# Patient Record
Sex: Male | Born: 1960 | Race: White | Hispanic: No | Marital: Single | Smoking: Former smoker
Health system: Southern US, Community
[De-identification: ages and names within clinical notes are randomized; demographics above are authoritative.]

## PROBLEM LIST (undated history)

## (undated) DIAGNOSIS — R112 Nausea with vomiting, unspecified: Secondary | ICD-10-CM

## (undated) DIAGNOSIS — K219 Gastro-esophageal reflux disease without esophagitis: Secondary | ICD-10-CM

## (undated) DIAGNOSIS — H9190 Unspecified hearing loss, unspecified ear: Secondary | ICD-10-CM

## (undated) DIAGNOSIS — R51 Headache: Secondary | ICD-10-CM

## (undated) DIAGNOSIS — I499 Cardiac arrhythmia, unspecified: Secondary | ICD-10-CM

## (undated) DIAGNOSIS — Z9889 Other specified postprocedural states: Secondary | ICD-10-CM

## (undated) DIAGNOSIS — R569 Unspecified convulsions: Secondary | ICD-10-CM

## (undated) DIAGNOSIS — I209 Angina pectoris, unspecified: Secondary | ICD-10-CM

## (undated) DIAGNOSIS — F419 Anxiety disorder, unspecified: Secondary | ICD-10-CM

---

## 1999-03-17 ENCOUNTER — Encounter: Admission: RE | Admit: 1999-03-17 | Discharge: 1999-03-17 | Payer: Self-pay | Admitting: Family Medicine

## 1999-03-17 ENCOUNTER — Ambulatory Visit (HOSPITAL_COMMUNITY): Admission: RE | Admit: 1999-03-17 | Discharge: 1999-03-17 | Payer: Self-pay | Admitting: Family Medicine

## 2001-04-28 ENCOUNTER — Encounter: Admission: RE | Admit: 2001-04-28 | Discharge: 2001-04-28 | Payer: Self-pay | Admitting: Family Medicine

## 2001-08-08 ENCOUNTER — Encounter: Admission: RE | Admit: 2001-08-08 | Discharge: 2001-08-08 | Payer: Self-pay | Admitting: Family Medicine

## 2004-05-26 ENCOUNTER — Encounter: Admission: RE | Admit: 2004-05-26 | Discharge: 2004-05-26 | Payer: Self-pay | Admitting: Allergy and Immunology

## 2007-09-01 ENCOUNTER — Ambulatory Visit (HOSPITAL_COMMUNITY): Admission: RE | Admit: 2007-09-01 | Discharge: 2007-09-01 | Payer: Self-pay | Admitting: General Surgery

## 2007-09-01 HISTORY — PX: HERNIA REPAIR: SHX51

## 2008-05-26 ENCOUNTER — Emergency Department (HOSPITAL_COMMUNITY): Admission: EM | Admit: 2008-05-26 | Discharge: 2008-05-26 | Payer: Self-pay | Admitting: Emergency Medicine

## 2008-05-29 ENCOUNTER — Telehealth (INDEPENDENT_AMBULATORY_CARE_PROVIDER_SITE_OTHER): Payer: Self-pay | Admitting: *Deleted

## 2008-05-30 ENCOUNTER — Encounter (INDEPENDENT_AMBULATORY_CARE_PROVIDER_SITE_OTHER): Payer: Self-pay | Admitting: Family Medicine

## 2010-07-06 ENCOUNTER — Ambulatory Visit (HOSPITAL_COMMUNITY)
Admission: RE | Admit: 2010-07-06 | Discharge: 2010-07-06 | Payer: Self-pay | Source: Home / Self Care | Attending: Surgery | Admitting: Surgery

## 2010-07-06 HISTORY — PX: HERNIA REPAIR: SHX51

## 2010-10-06 LAB — DIFFERENTIAL
Basophils Absolute: 0 10*3/uL (ref 0.0–0.1)
Basophils Relative: 1 % (ref 0–1)
Eosinophils Absolute: 0.1 10*3/uL (ref 0.0–0.7)
Eosinophils Relative: 2 % (ref 0–5)
Lymphocytes Relative: 17 % (ref 12–46)
Lymphs Abs: 1.1 10*3/uL (ref 0.7–4.0)
Monocytes Absolute: 0.5 10*3/uL (ref 0.1–1.0)
Monocytes Relative: 9 % (ref 3–12)
Neutro Abs: 4.3 10*3/uL (ref 1.7–7.7)
Neutrophils Relative %: 72 % (ref 43–77)

## 2010-10-06 LAB — CBC
HCT: 41.2 % (ref 39.0–52.0)
Hemoglobin: 14.4 g/dL (ref 13.0–17.0)
MCH: 32.8 pg (ref 26.0–34.0)
MCHC: 35 g/dL (ref 30.0–36.0)
MCV: 93.8 fL (ref 78.0–100.0)
Platelets: 194 10*3/uL (ref 150–400)
RBC: 4.39 MIL/uL (ref 4.22–5.81)
RDW: 13 % (ref 11.5–15.5)
WBC: 6.1 10*3/uL (ref 4.0–10.5)

## 2010-10-06 LAB — BASIC METABOLIC PANEL
BUN: 16 mg/dL (ref 6–23)
CO2: 27 mEq/L (ref 19–32)
Calcium: 9.4 mg/dL (ref 8.4–10.5)
Chloride: 106 mEq/L (ref 96–112)
Creatinine, Ser: 1.18 mg/dL (ref 0.4–1.5)
GFR calc Af Amer: >60 mL/min (ref >60)
GFR calc non Af Amer: >60 mL/min (ref >60)
Glucose, Bld: 109 mg/dL — ABNORMAL HIGH (ref 70–99)
Potassium: 3.9 mEq/L (ref 3.5–5.1)
Sodium: 141 mEq/L (ref 135–145)

## 2010-10-06 LAB — SURGICAL PCR SCREEN
MRSA, PCR: NEGATIVE
Staphylococcus aureus: POSITIVE — AB

## 2010-12-08 NOTE — Op Note (Signed)
Joel Lawrence, Joel Lawrence                ACCOUNT NO.:  [id number]   MEDICAL RECORD NO.:  [id number]          PATIENT TYPE:  AMB   LOCATION:  DAY                          FACILITY:  [facility]   PHYSICIAN:  Timothy E. Earlene Plater, M.D. DATE OF BIRTH:  [birth year]   DATE OF PROCEDURE:  09/01/2007  DATE OF DISCHARGE:                               OPERATIVE REPORT   PREOPERATIVE DIAGNOSES:  Left inguinal hernia.   POSTOPERATIVE DIAGNOSES:  Left direct inguinal hernia.   OPERATIVE PROCEDURE:  Repair of hernia with mesh.   SURGEON:  Dr. Kendrick Ranch.   This 50 year old Caucasian male has a painful and protruding left  inguinal hernia that is  increasing in size. He wishes to have it  repaired after careful discussion.  His primary risk factors are  obesity.  The patient was seen, identified, the left groin marked.  He  was taken to the operating room, placed supine, LMA general anesthesia  provided.  The left groin was clipped, prepped and draped in the usual  fashion.  Marcaine 0.25% with epinephrine was used throughout for wide  field block.  An incision made over the palpable hernia taken down to  the abundant subcutaneous fatty tissue. The external oblique was splayed  over a large direct hernia.  It was opened further proximally and a huge  lipoma was protruding through the medial aspect of the internal ring.  The cord was small, appeared normal, no evidence for indirect sac.  That  was dissected out and suspended outside laterally and this huge  lipomatous hernia was reduced back into its place.  There was a large  widening of the internal ring. I chose a large mesh patch and plug. I  placed a plug in the defect and sutured it in with individual #0 Prolene  sutures.  I then took the patch and placed it over the floor of the  canal up to and around the internal ring and sewed that to the  surrounding fascia with 2-0 Prolene.  This further reduced the hernia  and snugged up the internal ring.  The cord  structures passed normally.  The external oblique was then closed with 2-0 Vicryl. The deep subcu 2-0  Vicryl and the skin 3-0 Monocryl.  He seemed to have tolerated it well.  The counts were correct and the patient was stable.  He was removed to  the recovery room in good condition.   Written and verbal instructions were given including Percocet #36.  He  will be followed in the outpatient period.      Timothy E. Earlene Plater, M.D.  Electronically Signed     TED/MEDQ  D:  09/01/2007  T:  09/02/2007  Job:  [id number]

## 2011-04-16 LAB — HEMOGLOBIN AND HEMATOCRIT, BLOOD
HCT: 39.6
Hemoglobin: 14

## 2011-04-28 LAB — URINALYSIS, ROUTINE W REFLEX MICROSCOPIC
Bilirubin Urine: NEGATIVE
Glucose, UA: NEGATIVE
Ketones, ur: 15 — AB
Leukocytes, UA: NEGATIVE
Nitrite: NEGATIVE
Protein, ur: 30 — AB
Specific Gravity, Urine: 1.02
Urobilinogen, UA: 1
pH: 5.5

## 2011-04-28 LAB — URINE MICROSCOPIC-ADD ON

## 2011-04-28 LAB — COMPREHENSIVE METABOLIC PANEL
ALT: 49
AST: 36
Albumin: 4.2
Alkaline Phosphatase: 50
BUN: 11
CO2: 25
Calcium: 9.5
Chloride: 99
Creatinine, Ser: 1.14
GFR calc Af Amer: >60
GFR calc non Af Amer: >60
Glucose, Bld: 89
Potassium: 3.7
Sodium: 134 — ABNORMAL LOW
Total Bilirubin: 0.9
Total Protein: 8

## 2011-04-28 LAB — CBC
HCT: 45.5
Hemoglobin: 15.3
MCHC: 33.6
MCV: 92.9
Platelets: 359
RBC: 4.9
RDW: 13.3
WBC: 11.6 — ABNORMAL HIGH

## 2011-04-28 LAB — POCT I-STAT, CHEM 8
BUN: 12
Calcium, Ion: 1.16
Chloride: 100
Creatinine, Ser: 1.4
Glucose, Bld: 97
HCT: 49
Hemoglobin: 16.7
Potassium: 3.9
Sodium: 138
TCO2: 28

## 2011-04-28 LAB — DIFFERENTIAL
Basophils Absolute: 0.1
Basophils Relative: 1
Eosinophils Absolute: 0.1
Eosinophils Relative: 1
Lymphocytes Relative: 16
Lymphs Abs: 1.8
Monocytes Absolute: 0.7
Monocytes Relative: 6
Neutro Abs: 8.8 — ABNORMAL HIGH
Neutrophils Relative %: 77

## 2011-04-28 LAB — LACTIC ACID, PLASMA: Lactic Acid, Venous: 0.7

## 2011-04-28 LAB — LIPASE, BLOOD: Lipase: 25

## 2011-09-04 ENCOUNTER — Emergency Department (HOSPITAL_COMMUNITY)
Admission: EM | Admit: 2011-09-04 | Discharge: 2011-09-04 | Disposition: A | Discharge status: Home / Self Care | Payer: BC Managed Care – PPO | Attending: Emergency Medicine | Admitting: Emergency Medicine

## 2011-09-04 ENCOUNTER — Encounter (HOSPITAL_COMMUNITY): Payer: Self-pay | Admitting: Physical Medicine and Rehabilitation

## 2011-09-04 ENCOUNTER — Emergency Department (HOSPITAL_COMMUNITY): Payer: BC Managed Care – PPO

## 2011-09-04 DIAGNOSIS — K439 Ventral hernia without obstruction or gangrene: Secondary | ICD-10-CM

## 2011-09-04 DIAGNOSIS — Z79899 Other long term (current) drug therapy: Secondary | ICD-10-CM | POA: Insufficient documentation

## 2011-09-04 DIAGNOSIS — K436 Other and unspecified ventral hernia with obstruction, without gangrene: Secondary | ICD-10-CM | POA: Insufficient documentation

## 2011-09-04 LAB — URINALYSIS, ROUTINE W REFLEX MICROSCOPIC
Glucose, UA: NEGATIVE mg/dL
Hgb urine dipstick: NEGATIVE
Ketones, ur: 15 mg/dL — AB
Nitrite: NEGATIVE
Specific Gravity, Urine: 1.023 (ref 1.005–1.030)
Urobilinogen, UA: 1 mg/dL (ref 0.0–1.0)
pH: 8 (ref 5.0–8.0)

## 2011-09-04 LAB — BASIC METABOLIC PANEL
BUN: 15 mg/dL (ref 6–23)
Calcium: 9 mg/dL (ref 8.4–10.5)
Creatinine, Ser: 0.87 mg/dL (ref 0.50–1.35)
GFR calc non Af Amer: >90 mL/min (ref >90)
Glucose, Bld: 102 mg/dL — ABNORMAL HIGH (ref 70–99)
Sodium: 135 mEq/L (ref 135–145)

## 2011-09-04 MED ORDER — SODIUM CHLORIDE 0.9 % IV SOLN
INTRAVENOUS | Status: DC
  Administered 2011-09-04: 08:00:00 via INTRAVENOUS

## 2011-09-04 MED ORDER — IOHEXOL 300 MG/ML  SOLN: 20.0000 mL | INTRAMUSCULAR | Status: AC

## 2011-09-04 MED ORDER — ONDANSETRON HCL 4 MG/2ML IJ SOLN
4.0000 mg | Freq: Once | INTRAMUSCULAR | Status: AC
  Administered 2011-09-04: 4 mg via INTRAVENOUS
  Filled 2011-09-04: qty 2

## 2011-09-04 MED ORDER — OXYCODONE-ACETAMINOPHEN 5-325 MG PO TABS: 1.0000 | ORAL_TABLET | Freq: Four times a day (QID) | ORAL | Status: AC | PRN

## 2011-09-04 MED ORDER — HYDROMORPHONE HCL PF 1 MG/ML IJ SOLN
1.0000 mg | Freq: Once | INTRAMUSCULAR | Status: AC
  Administered 2011-09-04: 1 mg via INTRAVENOUS
  Filled 2011-09-04: qty 1

## 2011-09-04 MED ORDER — IOHEXOL 300 MG/ML  SOLN
120.0000 mL | Freq: Once | INTRAMUSCULAR | Status: AC | PRN
  Administered 2011-09-04: 120 mL via INTRAVENOUS

## 2011-09-04 NOTE — ED Provider Notes (Signed)
History     CSN: [id number]  Arrival date & time 09/04/11  4098   First MD Initiated Contact with Patient 09/04/11 [phone]      Chief Complaint  Patient presents with  . Abdominal Pain    (Consider location/radiation/quality/duration/timing/severity/associated sxs/prior treatment) HPI Comments: History of inguinal hernia repair. No nausea or vomiting. Has had normal bowel movements.  Patient is a 51 y.o. male presenting with abdominal pain. The history is provided by the patient. No language interpreter was used.  Abdominal Pain The primary symptoms of the illness include abdominal pain. The primary symptoms of the illness do not include fever, fatigue, shortness of breath, nausea, vomiting, diarrhea or dysuria. The current episode started yesterday. The onset of the illness was gradual. The problem has been gradually worsening.  The abdominal pain began yesterday. The pain came on gradually. The abdominal pain has been gradually worsening since its onset. The abdominal pain is located in the periumbilical region. The abdominal pain does not radiate. The abdominal pain is relieved by certain positions. The abdominal pain is exacerbated by movement.  Associated with: states began noticing pain when bending at work. The patient has not had a change in bowel habit. Symptoms associated with the illness do not include constipation, urgency or frequency.    History reviewed. No pertinent past medical history.  Past Surgical History  Procedure Date  . Hernia repair     History reviewed. No pertinent family history.  History  Substance Use Topics  . Smoking status: Never Smoker   . Smokeless tobacco: Not on file  . Alcohol Use: No      Review of Systems  Constitutional: Negative for fever, activity change, appetite change and fatigue.  HENT: Negative for congestion, sore throat, rhinorrhea, neck pain and neck stiffness.   Respiratory: Negative for cough and shortness of breath.     Cardiovascular: Negative for chest pain and palpitations.  Gastrointestinal: Positive for abdominal pain. Negative for nausea, vomiting, diarrhea and constipation.  Genitourinary: Negative for dysuria, urgency, frequency, flank pain, scrotal swelling and testicular pain.  Neurological: Negative for dizziness, weakness, light-headedness, numbness and headaches.  All other systems reviewed and are negative.    Allergies  Review of patient's allergies indicates no known allergies.  Home Medications   Current Outpatient Rx  Name Route Sig Dispense Refill  . OMEGA 3 PO Oral Take 3 capsules by mouth 2 (two) times daily.    Marland Kitchen OVER THE COUNTER MEDICATION Oral Take 1 capsule by mouth daily. Testosterone Booster from [facility] while in hospital    . OXYCODONE-ACETAMINOPHEN 5-325 MG PO TABS Oral Take 1-2 tablets by mouth every 6 (six) hours as needed for pain. 20 tablet 0    BP 144/81  Pulse 92  Temp(Src) 98.4 F (36.9 C) (Oral)  Resp 14  SpO2 96%  Physical Exam  Nursing note and vitals reviewed. Constitutional: He is oriented to person, place, and time. He appears well-developed and well-nourished. No distress.  HENT:  Head: Normocephalic and atraumatic.  Mouth/Throat: Oropharynx is clear and moist. No oropharyngeal exudate.  Eyes: Conjunctivae and EOM are normal. Pupils are equal, round, and reactive to light.  Neck: Normal range of motion. Neck supple.  Cardiovascular: Normal rate, regular rhythm, normal heart sounds and intact distal pulses.  Exam reveals no gallop and no friction rub.   No murmur heard. Pulmonary/Chest: Effort normal and breath sounds normal. No respiratory distress.  Abdominal: Soft. Bowel sounds are normal. There is tenderness (tenderness in the  periumbilical region where there is a palpable ventral hernia.  unable to reduce due to pain). There is no rebound and no guarding.  Genitourinary:       Easily reduced L inguinal hernia  Musculoskeletal: Normal range  of motion. He exhibits no tenderness.  Neurological: He is alert and oriented to person, place, and time. No cranial nerve deficit.  Skin: Skin is warm and dry. No rash noted.    ED Course  Procedures (including critical care time)  Labs Reviewed  URINALYSIS, ROUTINE W REFLEX MICROSCOPIC - Abnormal; Notable for the following:    APPearance HAZY (*)    Ketones, ur 15 (*)    All other components within normal limits  BASIC METABOLIC PANEL - Abnormal; Notable for the following:    Glucose, Bld 102 (*)    All other components within normal limits   Ct Abdomen Pelvis W Contrast  09/04/2011  *RADIOLOGY REPORT*  Clinical Data: Abdominal pain for 1 day.  Left inguinal hernia repair in 2011  CT ABDOMEN AND PELVIS WITH CONTRAST  Technique:  Multidetector CT imaging of the abdomen and pelvis was performed following the standard protocol during bolus administration of intravenous contrast.  Contrast: OMNIPAQUE IOHEXOL 300 MG/ML IV SOLN, 1 OMNIPAQUE IOHEXOL 300 MG/ML IV SOLN  Comparison: 05/26/2008  Findings: There is mild elevation of the left hemidiaphragm.  Lung bases are unremarkable.  Sagittal images of the spine are unremarkable.  Enhanced liver is unremarkable.  Gallbladder is contracted without evidence of calcified gallstones.  Enhanced pancreas, spleen and adrenal glands are unremarkable.  There is no small bowel obstruction.  No ascites or free air.  No adenopathy.  Significant stool noted in the cecum.  There is a low- lying cecum without evidence of pericecal inflammation.  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  In axial image 47 there is a midline supraumbilical hernia containing fat and some fluid.  The hernia measures 2.5 x 2.5 cm. There is mild stranding of the fat within hernia suspicious for mild inflammation. In axial image 50 there is 1.8 x 2.2 cm umbilical hernia containing omental fat without evidence of acute inflammation.  The  urinary bladder is unremarkable.  There is  left inguinal scrotal canal hernia containing fat measures 4 x 4.4 cm suspicious for recurrent hernia.  There is no evidence of acute complication. Prostate gland  and seminal vesicles are unremarkable.  No pelvic ascites or adenopathy.  No destructive bony lesions are noted within pelvis.  Enhanced kidneys are symmetrical in size.  No hydronephrosis or hydroureter.  Delayed renal images shows bilateral renal symmetrical excretion.  IMPRESSION:  1.  There is a low-lying cecum.  Significant stool noted in the cecum and right colon without evidence of pericecal inflammation. 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  2.  There is a supraumbilical midline abdominal wall hernia containing fat and some fluid measures 2.5 cm.  There is stranding of the fat within hernia suspicious for mild inflammation.  A second umbilical hernia containing omental fat is noted without evidence of acute complication. 3.  No small bowel obstruction. 4.  There is a left inguinal hernia containing fat measures 4.4 x 4.3 cm. 5.  No hydronephrosis or hydroureter.  Original Report Authenticated By: Natasha Mead, M.D.     1. Ventral hernia       MDM  Evaluated by general surgery for the mild incarceration associated  with this hernia. With some increased pressure the hernia was reduced. He will followup with the Gen. surgery clinic in one week. Provided pain medication. Provided clear signs and symptoms for which to return the emergency department        Dayton Bailiff, MD 09/04/11 1235

## 2011-09-04 NOTE — ED Notes (Signed)
Pt returned from CT scan.

## 2011-09-04 NOTE — ED Notes (Signed)
Pt presents to department for evaluation of abdominal pain. Onset Friday night while at work. Pt states history of hernia. 7/10 pain upon arrival to ED. No nausea/vomiting. Denies urinary symptoms. He is alert and oriented x4.

## 2011-09-04 NOTE — ED Notes (Signed)
Pt denies pain at the time. Vital signs stable. Encouraged to continue drinking oral contrast. Pt remains alert and oriented x4.

## 2011-09-04 NOTE — ED Notes (Signed)
Pt resting quietly at the time. Vital signs stable. Remains pain free. No signs of distress at the present.

## 2011-09-04 NOTE — ED Notes (Signed)
Pt presents to department for evaluation of abdominal pain. Onset last night while working and lifting heavy objects. Pt states he noticed a "protruding bubble" to epigastric region. 7/10 pain at the time. Abdomen tender to palpation. Bowel sounds present all quadrants. No nausea/vomiting. Denies urinary problems. States history of hernia repair at [employer]. Pt conscious alert and oriented x4.

## 2011-09-04 NOTE — ED Notes (Signed)
Pt transported to CT scan.

## 2011-09-04 NOTE — ED Notes (Signed)
Pt discharged home. Had no further questions. Encouraged to follow up with surgeon. Vital signs stable.

## 2011-09-14 ENCOUNTER — Encounter (INDEPENDENT_AMBULATORY_CARE_PROVIDER_SITE_OTHER): Payer: Self-pay | Admitting: Surgery

## 2011-09-16 ENCOUNTER — Ambulatory Visit (INDEPENDENT_AMBULATORY_CARE_PROVIDER_SITE_OTHER): Payer: BC Managed Care – PPO | Admitting: Surgery

## 2011-09-16 ENCOUNTER — Encounter (INDEPENDENT_AMBULATORY_CARE_PROVIDER_SITE_OTHER): Payer: Self-pay | Admitting: Surgery

## 2011-09-16 VITALS — BP 124/80 | HR 72 | Temp 97.9°F | Resp 18 | Ht 72.0 in | Wt 241.4 lb

## 2011-09-16 DIAGNOSIS — K4091 Unilateral inguinal hernia, without obstruction or gangrene, recurrent: Secondary | ICD-10-CM

## 2011-09-16 DIAGNOSIS — K439 Ventral hernia without obstruction or gangrene: Secondary | ICD-10-CM | POA: Insufficient documentation

## 2011-09-16 NOTE — Progress Notes (Signed)
Patient ID: Joel Lawrence, male   DOB: 10-Oct-1960, 51 y.o.   MRN: [id number]  Chief Complaint  Patient presents with  . Inguinal Hernia    Recurrent - left    HPI Joel Lawrence is a 51 y.o. male.  Follow-up after recent ED visit HPI This is a healthy 51 yo male who is s/p open repair of a large direct left inguinal hernia in 2009 by Dr. Kendrick Ranch.  The hernia was repaired with a large Prolene plug and patch.  He developed a recurrence and I repaired an indirect hernia on the left  in 2011.  I repaired the hernia by suturing down the old mesh with permanent suture.  He seems to have developed a recurrence in his left groin again.  He went to the ED on 2/9 with severe epigastric pain and was found to have two midline ventral hernias (umbilical and supraumbilical).  I was able to reduce these manually and they have been less painful.  He comes in today for discussion of surgical repair of all of these hernias. Past Medical History  Diagnosis Date  . Chest pain     Past Surgical History  Procedure Date  . Hernia repair 09/01/2007    Left Inguinal hernia  . Hernia repair 07/06/2010    recurrent left inguinal hernia    History reviewed. No pertinent family history.  Social History History  Substance Use Topics  . Smoking status: Never Smoker   . Smokeless tobacco: Never Used  . Alcohol Use: No    No Known Allergies  Current Outpatient Prescriptions  Medication Sig Dispense Refill  . NON FORMULARY as needed. Workout liquid supplement called [facility].      . Omega-3 Fatty Acids (OMEGA 3 PO) Take 3 capsules by mouth 2 (two) times daily.      Marland Kitchen OVER THE COUNTER MEDICATION Take 1 capsule by mouth daily. Testosterone Booster from [employer] while in hospital        Review of Systems Review of Systems  Constitutional: Negative for fever, chills and unexpected weight change.  HENT: Negative for hearing loss, congestion, sore throat, trouble swallowing and voice change.   Eyes:  Negative for visual disturbance.  Respiratory: Negative for cough and wheezing.   Cardiovascular: Positive for chest pain. Negative for palpitations and leg swelling.  Gastrointestinal: Positive for abdominal pain. Negative for nausea, vomiting, diarrhea, constipation, blood in stool, abdominal distention, anal bleeding and rectal pain.  Genitourinary: Negative for hematuria and difficulty urinating.  Musculoskeletal: Negative for arthralgias.  Skin: Negative for rash and wound.  Neurological: Negative for seizures, syncope, weakness and headaches.  Hematological: Negative for adenopathy. Does not bruise/bleed easily.  Psychiatric/Behavioral: Negative for confusion.    Blood pressure 124/80, pulse 72, temperature 97.9 F (36.6 C), temperature source Temporal, resp. rate 18, height 6' (1.829 m), weight 241 lb 6.4 oz (109.498 kg).  Physical Exam Physical Exam WDWN in NAD HEENT:  EOMI, sclera anicteric Neck:  No masses, no thyromegaly Lungs:  CTA bilaterally; normal respiratory effort CV:  Regular rate and rhythm; no murmurs Abd:  +bowel sounds, soft, non-tender; palpable hernia above the umbilicus and another one at the umbilicus; non-reducible; minimally tender GU:  Visible left inguinal hernia; reducible; no sign of right inguinal hernia Ext:  Well-perfused; no edema Skin:  Warm, dry; no sign of jaundice  Data Reviewed CT scan 09/04/11   Assessment    1.  Recurrent left inguinal hernia 2.  Two midline ventral hernias with incarcerated  omentum    Plan    Laparoscopic ventral hernia repair with mesh Laparoscopic repair of recurrent left inguinal hernia, possible open repair The surgical procedure has been discussed with the patient.  Potential risks, benefits, alternative treatments, and expected outcomes have been explained.  All of the patient's questions at this time have been answered.  The likelihood of reaching the patient's treatment goal is good.  The patient understand  the proposed surgical procedure and wishes to proceed.         Manika Hast K. 09/16/2011, 11:41 AM

## 2011-10-15 ENCOUNTER — Encounter (HOSPITAL_COMMUNITY): Payer: Self-pay | Admitting: Pharmacy Technician

## 2011-10-18 ENCOUNTER — Encounter (HOSPITAL_COMMUNITY): Payer: Self-pay

## 2011-10-18 ENCOUNTER — Encounter (HOSPITAL_COMMUNITY)
Admission: RE | Admit: 2011-10-18 | Discharge: 2011-10-18 | Disposition: A | Discharge status: Home / Self Care | Payer: BC Managed Care – PPO | Source: Ambulatory Visit | Attending: Surgery | Admitting: Surgery

## 2011-10-18 HISTORY — DX: Unspecified convulsions: R56.9

## 2011-10-18 HISTORY — DX: Other specified postprocedural states: Z98.890

## 2011-10-18 HISTORY — DX: Gastro-esophageal reflux disease without esophagitis: K21.9

## 2011-10-18 HISTORY — DX: Nausea with vomiting, unspecified: R11.2

## 2011-10-18 LAB — SURGICAL PCR SCREEN: MRSA, PCR: NEGATIVE

## 2011-10-18 LAB — CBC
MCH: 31.4 pg (ref 26.0–34.0)
MCHC: 34 g/dL (ref 30.0–36.0)
Platelets: 224 10*3/uL (ref 150–400)
RBC: 5.09 MIL/uL (ref 4.22–5.81)
RDW: 12.8 % (ref 11.5–15.5)

## 2011-10-18 NOTE — Progress Notes (Signed)
10/18/11 1345  OBSTRUCTIVE SLEEP APNEA  Have you ever been diagnosed with sleep apnea through a sleep study? No  Do you snore loudly (loud enough to be heard through closed doors)?  1  Do you often feel tired, fatigued, or sleepy during the daytime? 1  Has anyone observed you stop breathing during your sleep? 0  Do you have, or are you being treated for high blood pressure? 0  BMI more than 35 kg/m2? 0  Age over 53 years old? 1  Neck circumference greater than 40 cm/18 inches? 0  Gender: 1  Obstructive Sleep Apnea Score 4   Score 4 or greater  Updated health history

## 2011-10-18 NOTE — Patient Instructions (Signed)
20 [name]  10/18/2011   Your procedure is scheduled on:  10/20/11 1235pm-1525pm  Report to [facility] at 1000 AM.  Call this number if you have problems the morning of surgery: [phone]   Remember:   Do not eat food:After Midnight.  May have clear liquids:until Midnight .  Marland Kitchen  Take these medicines the morning of surgery with A SIP OF WATER:    Do not wear jewelry  Do not wear lotions, powders, or perfumes    Do not bring valuables to the hospital.  Contacts, dentures or bridgework may not be worn into surgery.  Leave suitcase in the car. After surgery it may be brought to your room.  For patients admitted to the hospital, checkout time is 11:00 AM the day of discharge.      Special Instructions: CHG Shower Use Special Wash: 1/2 bottle night before surgery and 1/2 bottle morning of surgery. shower chin to toes with CHG.  Wash face and private parts with regular soap.     Please read over the following fact sheets that you were given: MRSA Information, coughing and deep breathing exercises , leg exercises

## 2011-10-19 NOTE — H&P (Signed)
Chief Complaint   Patient presents with   .  Inguinal Hernia       Recurrent - left        HPI Joel Lawrence is a 51 y.o. male.  Follow-up after recent ED visit HPI This is a healthy 51 yo male who is s/p open repair of a large direct left inguinal hernia in 2009 by Dr. Kendrick Ranch.  The hernia was repaired with a large Prolene plug and patch.  He developed a recurrence and I repaired an indirect hernia on the left  in 2011.  I repaired the hernia by suturing down the old mesh with permanent suture.  He seems to have developed a recurrence in his left groin again.  He went to the ED on 2/9 with severe epigastric pain and was found to have two midline ventral hernias (umbilical and supraumbilical).  I was able to reduce these manually and they have been less painful.  He comes in today for discussion of surgical repair of all of these hernias. Past Medical History   Diagnosis  Date   .  Chest pain           Past Surgical History   Procedure  Date   .  Hernia repair  09/01/2007       Left Inguinal hernia   .  Hernia repair  07/06/2010       recurrent left inguinal hernia        History reviewed. No pertinent family history.   Social History History   Substance Use Topics   .  Smoking status:  Never Smoker    .  Smokeless tobacco:  Never Used   .  Alcohol Use:  No        No Known Allergies    Current Outpatient Prescriptions   Medication  Sig  Dispense  Refill   .  NON FORMULARY  as needed. Workout liquid supplement called [facility].         .  Omega-3 Fatty Acids (OMEGA 3 PO)  Take 3 capsules by mouth 2 (two) times daily.         Marland Kitchen  OVER THE COUNTER MEDICATION  Take 1 capsule by mouth daily. Testosterone Booster from [employer] while in hospital              Review of Systems Review of Systems  Constitutional: Negative for fever, chills and unexpected weight change.  HENT: Negative for hearing loss, congestion, sore throat, trouble swallowing and  voice change.   Eyes: Negative for visual disturbance.  Respiratory: Negative for cough and wheezing.   Cardiovascular: Positive for chest pain. Negative for palpitations and leg swelling.  Gastrointestinal: Positive for abdominal pain. Negative for nausea, vomiting, diarrhea, constipation, blood in stool, abdominal distention, anal bleeding and rectal pain.  Genitourinary: Negative for hematuria and difficulty urinating.  Musculoskeletal: Negative for arthralgias.  Skin: Negative for rash and wound.  Neurological: Negative for seizures, syncope, weakness and headaches.  Hematological: Negative for adenopathy. Does not bruise/bleed easily.  Psychiatric/Behavioral: Negative for confusion.      Blood pressure 124/80, pulse 72, temperature 97.9 F (36.6 C), temperature source Temporal, resp. rate 18, height 6' (1.829 m), weight 241 lb 6.4 oz (109.498 kg).   Physical Exam Physical Exam WDWN in NAD HEENT:  EOMI, sclera anicteric Neck:  No masses, no thyromegaly Lungs:  CTA bilaterally; normal respiratory effort CV:  Regular rate and rhythm; no murmurs Abd:  +bowel  sounds, soft, non-tender; palpable hernia above the umbilicus and another one at the umbilicus; non-reducible; minimally tender GU:  Visible left inguinal hernia; reducible; no sign of right inguinal hernia Ext:  Well-perfused; no edema Skin:  Warm, dry; no sign of jaundice   Data Reviewed CT scan 09/04/11              Assessment    1.  Recurrent left inguinal hernia 2.  Two midline ventral hernias with incarcerated omentum     Plan    Laparoscopic ventral hernia repair with mesh Laparoscopic repair of recurrent left inguinal hernia, possible open repair The surgical procedure has been discussed with the patient.  Potential risks, benefits, alternative treatments, and expected outcomes have been explained.  All of the patient's questions at this time have been answered.  The likelihood of reaching the patient's  treatment goal is good.  The patient understand the proposed surgical procedure and wishes to proceed.   Wilmon Arms. Corliss Skains, MD, [facility]  10/19/2011 9:44 PM

## 2011-10-20 ENCOUNTER — Encounter (HOSPITAL_COMMUNITY): Payer: Self-pay | Admitting: *Deleted

## 2011-10-20 ENCOUNTER — Ambulatory Visit (HOSPITAL_COMMUNITY): Payer: BC Managed Care – PPO | Admitting: Anesthesiology

## 2011-10-20 ENCOUNTER — Encounter (HOSPITAL_COMMUNITY)
Admission: RE | Disposition: A | Discharge status: Home / Self Care | Payer: Self-pay | Source: Ambulatory Visit | Attending: Surgery

## 2011-10-20 ENCOUNTER — Ambulatory Visit (HOSPITAL_COMMUNITY)
Admission: RE | Admit: 2011-10-20 | Discharge: 2011-10-21 | Disposition: A | Discharge status: Home / Self Care | Payer: BC Managed Care – PPO | Source: Ambulatory Visit | Attending: Surgery | Admitting: Surgery

## 2011-10-20 ENCOUNTER — Encounter (HOSPITAL_COMMUNITY): Payer: Self-pay | Admitting: Anesthesiology

## 2011-10-20 DIAGNOSIS — K4091 Unilateral inguinal hernia, without obstruction or gangrene, recurrent: Secondary | ICD-10-CM

## 2011-10-20 DIAGNOSIS — K439 Ventral hernia without obstruction or gangrene: Secondary | ICD-10-CM

## 2011-10-20 DIAGNOSIS — K436 Other and unspecified ventral hernia with obstruction, without gangrene: Secondary | ICD-10-CM | POA: Insufficient documentation

## 2011-10-20 HISTORY — PX: INGUINAL HERNIA REPAIR: SHX194

## 2011-10-20 HISTORY — PX: VENTRAL HERNIA REPAIR: SHX424

## 2011-10-20 LAB — CBC
Hemoglobin: 14.5 g/dL (ref 13.0–17.0)
MCHC: 34 g/dL (ref 30.0–36.0)
Platelets: 193 10*3/uL (ref 150–400)
RBC: 4.6 MIL/uL (ref 4.22–5.81)

## 2011-10-20 LAB — CREATININE, SERUM
Creatinine, Ser: 1.15 mg/dL (ref 0.50–1.35)
GFR calc non Af Amer: 71 mL/min — ABNORMAL LOW (ref >90)

## 2011-10-20 SURGERY — REPAIR, HERNIA, VENTRAL, LAPAROSCOPIC
Anesthesia: General | Wound class: Clean

## 2011-10-20 MED ORDER — ONDANSETRON HCL 4 MG PO TABS: 4.0000 mg | ORAL_TABLET | Freq: Four times a day (QID) | ORAL | Status: DC | PRN

## 2011-10-20 MED ORDER — HYDROMORPHONE HCL PF 1 MG/ML IJ SOLN: 0.2500 mg | INTRAMUSCULAR | Status: DC | PRN

## 2011-10-20 MED ORDER — KETOROLAC TROMETHAMINE 15 MG/ML IJ SOLN
15.0000 mg | Freq: Four times a day (QID) | INTRAMUSCULAR | Status: DC
  Administered 2011-10-20 – 2011-10-21 (×4): 15 mg via INTRAVENOUS
  Filled 2011-10-20 (×6): qty 1

## 2011-10-20 MED ORDER — FENTANYL CITRATE 0.05 MG/ML IJ SOLN
INTRAMUSCULAR | Status: DC | PRN
  Administered 2011-10-20 (×2): 50 ug via INTRAVENOUS
  Administered 2011-10-20: 100 ug via INTRAVENOUS
  Administered 2011-10-20: 50 ug via INTRAVENOUS

## 2011-10-20 MED ORDER — CEFAZOLIN SODIUM 1-5 GM-% IV SOLN
INTRAVENOUS | Status: AC
  Filled 2011-10-20: qty 100

## 2011-10-20 MED ORDER — HYDROMORPHONE HCL PF 1 MG/ML IJ SOLN
1.0000 mg | INTRAMUSCULAR | Status: DC | PRN
  Administered 2011-10-21: 1 mg via INTRAVENOUS
  Filled 2011-10-20: qty 1

## 2011-10-20 MED ORDER — DROPERIDOL 2.5 MG/ML IJ SOLN
INTRAMUSCULAR | Status: DC | PRN
  Administered 2011-10-20: 0.625 mg via INTRAVENOUS

## 2011-10-20 MED ORDER — GLYCOPYRROLATE 0.2 MG/ML IJ SOLN
INTRAMUSCULAR | Status: DC | PRN
  Administered 2011-10-20: .6 mg via INTRAVENOUS

## 2011-10-20 MED ORDER — ACETAMINOPHEN 10 MG/ML IV SOLN
INTRAVENOUS | Status: AC
  Filled 2011-10-20: qty 100

## 2011-10-20 MED ORDER — HYDROMORPHONE HCL PF 1 MG/ML IJ SOLN
INTRAMUSCULAR | Status: DC | PRN
  Administered 2011-10-20 (×2): 1 mg via INTRAVENOUS

## 2011-10-20 MED ORDER — KETOROLAC TROMETHAMINE 15 MG/ML IJ SOLN
INTRAMUSCULAR | Status: AC
  Filled 2011-10-20: qty 1

## 2011-10-20 MED ORDER — CEFAZOLIN SODIUM-DEXTROSE 2-3 GM-% IV SOLR
2.0000 g | INTRAVENOUS | Status: AC
  Administered 2011-10-20: 2 g via INTRAVENOUS

## 2011-10-20 MED ORDER — NEOSTIGMINE METHYLSULFATE 1 MG/ML IJ SOLN
INTRAMUSCULAR | Status: DC | PRN
  Administered 2011-10-20: 5 mg via INTRAVENOUS

## 2011-10-20 MED ORDER — PROPOFOL 10 MG/ML IV EMUL
INTRAVENOUS | Status: DC | PRN
  Administered 2011-10-20: 200 mg via INTRAVENOUS

## 2011-10-20 MED ORDER — BUPIVACAINE-EPINEPHRINE PF 0.25-1:200000 % IJ SOLN
INTRAMUSCULAR | Status: AC
  Filled 2011-10-20: qty 30

## 2011-10-20 MED ORDER — SCOPOLAMINE 1 MG/3DAYS TD PT72
1.0000 | MEDICATED_PATCH | Freq: Once | TRANSDERMAL | Status: DC
  Administered 2011-10-20: 1.5 mg via TRANSDERMAL
  Filled 2011-10-20: qty 1

## 2011-10-20 MED ORDER — CEFAZOLIN SODIUM 1-5 GM-% IV SOLN
1.0000 g | Freq: Four times a day (QID) | INTRAVENOUS | Status: AC
  Administered 2011-10-20 – 2011-10-21 (×3): 1 g via INTRAVENOUS
  Filled 2011-10-20 (×4): qty 50

## 2011-10-20 MED ORDER — SODIUM CHLORIDE 0.9 % IV SOLN
INTRAVENOUS | Status: DC
  Administered 2011-10-20: 18:00:00 via INTRAVENOUS

## 2011-10-20 MED ORDER — MIDAZOLAM HCL 5 MG/5ML IJ SOLN
INTRAMUSCULAR | Status: DC | PRN
  Administered 2011-10-20: 2 mg via INTRAVENOUS

## 2011-10-20 MED ORDER — ENOXAPARIN SODIUM 40 MG/0.4ML ~~LOC~~ SOLN
40.0000 mg | SUBCUTANEOUS | Status: DC
  Administered 2011-10-21: 40 mg via SUBCUTANEOUS
  Filled 2011-10-20 (×2): qty 0.4

## 2011-10-20 MED ORDER — ROCURONIUM BROMIDE 100 MG/10ML IV SOLN
INTRAVENOUS | Status: DC | PRN
  Administered 2011-10-20: 60 mg via INTRAVENOUS
  Administered 2011-10-20: 20 mg via INTRAVENOUS
  Administered 2011-10-20: 10 mg via INTRAVENOUS

## 2011-10-20 MED ORDER — 0.9 % SODIUM CHLORIDE (POUR BTL) OPTIME
TOPICAL | Status: DC | PRN
  Administered 2011-10-20: 1000 mL

## 2011-10-20 MED ORDER — ACETAMINOPHEN 10 MG/ML IV SOLN
INTRAVENOUS | Status: DC | PRN
  Administered 2011-10-20: 1000 mg via INTRAVENOUS

## 2011-10-20 MED ORDER — OXYCODONE-ACETAMINOPHEN 5-325 MG PO TABS: 1.0000 | ORAL_TABLET | ORAL | Status: DC | PRN

## 2011-10-20 MED ORDER — DEXAMETHASONE SODIUM PHOSPHATE 10 MG/ML IJ SOLN
INTRAMUSCULAR | Status: DC | PRN
  Administered 2011-10-20: 10 mg via INTRAVENOUS

## 2011-10-20 MED ORDER — BUPIVACAINE-EPINEPHRINE 0.25% -1:200000 IJ SOLN
INTRAMUSCULAR | Status: DC | PRN
  Administered 2011-10-20: 17 mL

## 2011-10-20 MED ORDER — LACTATED RINGERS IV SOLN
INTRAVENOUS | Status: DC
  Administered 2011-10-20: 1000 mL via INTRAVENOUS
  Administered 2011-10-20 (×3): via INTRAVENOUS

## 2011-10-20 MED ORDER — SCOPOLAMINE 1 MG/3DAYS TD PT72
MEDICATED_PATCH | TRANSDERMAL | Status: AC
  Filled 2011-10-20: qty 1

## 2011-10-20 MED ORDER — ONDANSETRON HCL 4 MG/2ML IJ SOLN: 4.0000 mg | Freq: Four times a day (QID) | INTRAMUSCULAR | Status: DC | PRN

## 2011-10-20 MED ORDER — ONDANSETRON HCL 4 MG/2ML IJ SOLN
INTRAMUSCULAR | Status: DC | PRN
  Administered 2011-10-20: 4 mg via INTRAVENOUS

## 2011-10-20 SURGICAL SUPPLY — 64 items
3dmax ×3 IMPLANT
APPLIER CLIP 5 13 M/L LIGAMAX5 (MISCELLANEOUS)
BENZOIN TINCTURE PRP APPL 2/3 (GAUZE/BANDAGES/DRESSINGS) ×3 IMPLANT
BINDER ABD UNIV 12 45-62 (WOUND CARE) ×2 IMPLANT
BINDER ABDOMINAL 46IN 62IN (WOUND CARE) ×3
CANISTER SUCTION 2500CC (MISCELLANEOUS) ×3 IMPLANT
CANNULA ENDOPATH XCEL 11M (ENDOMECHANICALS) IMPLANT
CHLORAPREP W/TINT 10.5 ML (MISCELLANEOUS) ×3 IMPLANT
CLIP APPLIE 5 13 M/L LIGAMAX5 (MISCELLANEOUS) IMPLANT
CLOTH BEACON ORANGE TIMEOUT ST (SAFETY) ×3 IMPLANT
COVER SURGICAL LIGHT HANDLE (MISCELLANEOUS) ×3 IMPLANT
DECANTER SPIKE VIAL GLASS SM (MISCELLANEOUS) IMPLANT
DERMABOND ADVANCED (GAUZE/BANDAGES/DRESSINGS) ×2
DERMABOND ADVANCED .7 DNX12 (GAUZE/BANDAGES/DRESSINGS) ×4 IMPLANT
DEVICE SECURE STRAP 25 ABSORB (INSTRUMENTS) ×6 IMPLANT
DEVICE TROCAR PUNCTURE CLOSURE (ENDOMECHANICALS) ×3 IMPLANT
DISSECT BALLN SPACEMKR + OVL (BALLOONS)
DISSECTOR BALLN SPACEMKR + OVL (BALLOONS) IMPLANT
DISSECTOR BLUNT TIP ENDO 5MM (MISCELLANEOUS) IMPLANT
DRAIN CHANNEL RND F F (WOUND CARE) IMPLANT
DRAPE LAPAROSCOPIC ABDOMINAL (DRAPES) ×3 IMPLANT
DRAPE UTILITY XL STRL (DRAPES) ×3 IMPLANT
ELECT REM PT RETURN 9FT ADLT (ELECTROSURGICAL) ×3
ELECTRODE REM PT RTRN 9FT ADLT (ELECTROSURGICAL) ×2 IMPLANT
EVACUATOR SILICONE 100CC (DRAIN) IMPLANT
FILTER SMOKE EVAC LAPAROSHD (FILTER) IMPLANT
GLOVE BIO SURGEON STRL SZ7 (GLOVE) ×3 IMPLANT
GLOVE BIOGEL PI IND STRL 7.0 (GLOVE) ×2 IMPLANT
GLOVE BIOGEL PI IND STRL 7.5 (GLOVE) ×2 IMPLANT
GLOVE BIOGEL PI INDICATOR 7.0 (GLOVE) ×1
GLOVE BIOGEL PI INDICATOR 7.5 (GLOVE) ×1
GOWN STRL NON-REIN LRG LVL3 (GOWN DISPOSABLE) ×6 IMPLANT
GOWN STRL REIN XL XLG (GOWN DISPOSABLE) ×6 IMPLANT
IV LACTATED RINGERS 1000ML (IV SOLUTION) IMPLANT
KIT BASIN OR (CUSTOM PROCEDURE TRAY) ×3 IMPLANT
NEEDLE INSUFFLATION 14GA 120MM (NEEDLE) IMPLANT
NEEDLE SPNL 22GX3.5 QUINCKE BK (NEEDLE) ×3 IMPLANT
NS IRRIG 1000ML POUR BTL (IV SOLUTION) ×3 IMPLANT
PEN SKIN MARKING BROAD (MISCELLANEOUS) ×3 IMPLANT
PENCIL BUTTON HOLSTER BLD 10FT (ELECTRODE) IMPLANT
SCALPEL HARMONIC ACE (MISCELLANEOUS) ×3 IMPLANT
SCISSORS LAP 5X35 DISP (ENDOMECHANICALS) IMPLANT
SET IRRIG TUBING LAPAROSCOPIC (IRRIGATION / IRRIGATOR) IMPLANT
SOLUTION ANTI FOG 6CC (MISCELLANEOUS) ×3 IMPLANT
STAPLER VISISTAT 35W (STAPLE) IMPLANT
STRIP CLOSURE SKIN 1/2X4 (GAUZE/BANDAGES/DRESSINGS) ×3 IMPLANT
SUT MNCRL AB 4-0 PS2 18 (SUTURE) ×3 IMPLANT
SUT NOVA NAB DX-16 0-1 5-0 T12 (SUTURE) IMPLANT
SUT NOVA NAB GS-21 0 18 T12 DT (SUTURE) IMPLANT
SUT PROLENE 0 CT 1 CR/8 (SUTURE) IMPLANT
SUT VICRYL 0 TIES 12 18 (SUTURE) IMPLANT
TACKER 5MM HERNIA 3.5CML NAB (ENDOMECHANICALS) IMPLANT
TOWEL OR 17X26 10 PK STRL BLUE (TOWEL DISPOSABLE) ×6 IMPLANT
TRAY FOLEY CATH 14FRSI W/METER (CATHETERS) ×3 IMPLANT
TRAY LAP CHOLE (CUSTOM PROCEDURE TRAY) ×3 IMPLANT
TROCAR BLADELESS OPT 5 75 (ENDOMECHANICALS) ×12 IMPLANT
TROCAR THREADED VISUAL INSUFF (TROCAR) IMPLANT
TROCAR XCEL 12X100 BLDLESS (ENDOMECHANICALS) IMPLANT
TROCAR XCEL NON-BLD 11X100MML (ENDOMECHANICALS) ×3 IMPLANT
TROCAR Z-THREAD FIOS 12X100MM (TROCAR) IMPLANT
TUBING FILTER THERMOFLATOR (ELECTROSURGICAL) IMPLANT
TUBING INSUFFLATION 10FT LAP (TUBING) ×3 IMPLANT
WARMER LAPAROSCOPE (MISCELLANEOUS) IMPLANT
ventralight ×3 IMPLANT

## 2011-10-20 NOTE — Transfer of Care (Signed)
Immediate Anesthesia Transfer of Care Note  Patient: Joel Lawrence  Procedure(s) Performed: Procedure(s) (LRB): LAPAROSCOPIC VENTRAL HERNIA (N/A) LAPAROSCOPIC INGUINAL HERNIA (Left) INSERTION OF MESH (N/A) RECURRENT HERNIA (Left)  Patient Location: PACU  Anesthesia Type: General  Level of Consciousness: sedated  Airway & Oxygen Therapy: Patient Spontanous Breathing and Patient connected to face mask oxygen  Post-op Assessment: Report given to PACU RN and Post -op Vital signs reviewed and stable  Post vital signs: Reviewed and stable  Complications: No apparent anesthesia complications

## 2011-10-20 NOTE — Op Note (Signed)
Laparoscopic Ventral Hernia Repair Procedure Note  Indications: Symptomatic ventral hernias/ recurrent left inguinal hernia  Pre-operative Diagnosis: ventral hernia x 2; recurrent left inguinal hernia  Post-operative Diagnosis: ventral hernia x 2; recurrent left inguinal hernia  Surgeon: Erika Hussar K.   Assistants: Dr. Gaynelle Adu, MD, FACS Dr. Andrey Campanile performed the laparoscopic repair of the recurrent left inguinal hernia and that note is dictated separately.  Anesthesia: General endotracheal anesthesia  ASA Class: 2  Procedure Details  The patient was seen in the Holding Room. The risks, benefits, complications, treatment options, and expected outcomes were discussed with the patient. The possibilities of reaction to medication, pulmonary aspiration, perforation of viscus, bleeding, recurrent infection, the need for additional procedures, failure to diagnose a condition, and creating a complication requiring transfusion or operation were discussed with the patient. The patient concurred with the proposed plan, giving informed consent.  The site of surgery properly noted/marked. The patient was taken to the operating room, identified as Joel Lawrence and the procedure verified as laparoscopic ventral hernia repair with mesh. A Time Out was held and the above information confirmed.    The patient was placed supine.  After establishing general anesthesia, a Foley catheter was placed.  The abdomen was prepped with Chloraprep and draped in standard fashion.  A 5 mm Optiview was used the cannulate the peritoneal cavity in the left upper quadrant below the costal margin.  Pneumoperitoneum was obtained by insufflating CO2, maintaining a maximum pressure of 15 mmHg.  The 5 mm 30-degree laparoscopic was inserted. The patient has two small hernia defects at and above the umbilicus.  These seem to contain only preperitoneal fat and the falciform ligament.   An 11-mm port was placed in the left anterior  axillary line at the level of the umbilicus.  Another 5-mm port was placed in the left lower quadrant.  Harmonic scalpel and gentle traction were used to dissect the preperitoneal fat and ventral hernia sacs away from the anterior abdominal wall.  We cleared the entire abdominal wall and were able to visualize both fascial defects.  At this point, Dr. Andrey Campanile arrived to perform the laparoscopic repair of the recurrent inguinal hernia on the left.  I had asked him to participate in this portion of the case due to his expertise with TAP hernia repairs.  After he completed that portion of the case, I removed the 5 mm port below the umbilicus and temporarily closed the skin with a 2-0 ethilon suture.   We used a spinal needle to identify the extent of the hernia defects.  This covered an area of about 8 cms.  We selected a 10 x 15 piece of Bard ventral hernia mesh.  We placed stay sutures of 0 Novofil around the edges of the mesh.  The mesh was then rolled up and inserted through the 11 mm port site.  The mesh was then unrolled.  The stay sutures were then pulled up through small stab incisions using the Endo-close device.  This deployed the mesh widely over the fascial defects.  The stay sutures were then tied down.  The Secure Strap device was then used to tack down the edges of the mesh at 1 cm intervals circumferentially. We placed an additional 5 mm port site on the right to allow proper placement of the tacks. We placed a few tacks inside the outer ring of tacks.  We inspected for hemostasis.  The fascial defect at the 11 mm port site was closed with a 0  Vicryl using the Endoclose device.  Pneumoperitoneum was then released as we removed the remainder of the trocars.  The port sites were closed with 4-0 Monocryl.  All of the incisions and stay suture sites were then sealed with Dermabond.  An abdominal binder was placed around the patient's abdomen.  The patient was extubated and brought to the recovery room in  stable condition.  All sponge, instrument, and needle counts were correct prior to closure and at the conclusion of the case.   Findings: Two small (1.5 cm) defects at the umbilicus and just above the umbilicus containing preperitoneal fat; recurrent indirect left inguinal hernia  Estimated Blood Loss:  Minimal         Complications:  None; patient tolerated the procedure well.         Disposition: PACU - hemodynamically stable.         Condition: stable  Wilmon Arms. Corliss Skains, MD, [facility]  10/20/2011 2:16 PM

## 2011-10-20 NOTE — Interval H&P Note (Signed)
History and Physical Interval Note:  10/20/2011 11:41 AM  Joel Lawrence  has presented today for surgery, with the diagnosis of Multiple ventral hernias; recurrent left inguinal hernias  The various methods of treatment have been discussed with the patient and family. After consideration of risks, benefits and other options for treatment, the patient has consented to  Procedure(s) (LRB): LAPAROSCOPIC VENTRAL HERNIA (N/A) LAPAROSCOPIC INGUINAL HERNIA (Left) INSERTION OF MESH (N/A) as a surgical intervention .  The patients' history has been reviewed, patient examined, no change in status, stable for surgery.  I have reviewed the patients' chart and labs.  Questions were answered to the patient's satisfaction.     [name]

## 2011-10-20 NOTE — Op Note (Signed)
10/20/2011  Joel Lawrence 1960-08-03   PREOPERATIVE DIAGNOSIS: Recurrent left inguinal hernia.   POSTOPERATIVE DIAGNOSIS: Recurrent left indirect inguinal hernia.   PROCEDURE: Laparoscopic repair of recurrent left indirect inguinal hernia with  mesh (TAPP).   SURGEON: Mary Sella. Andrey Campanile, MD   ASSISTANT SURGEON: Manus Rudd, MD  ANESTHESIA: General plus local consisting of 0.25% Marcaine with epi.   ESTIMATED BLOOD LOSS: Minimal.   FINDINGS: The patient had a recurrent left indirect inguinal hernia.  It was repaired using a 4 inch x 6  inch piece of Bard large 3DMax lightmesh.   SPECIMEN: none  INDICATIONS FOR PROCEDURE: The patient is 51yo gentleman who has had a previous open repair of a large direct inguinal hernia with plug by Dr Earlene Plater a while ago. He recurred in 2011 and Dr Corliss Skains performed an open repair of his recurrent left inguinal hernia by re-suturing the mesh around the defect. Unfortunately, he has had a recurrence. He also has several ventral hernias. Dr Corliss Skains has taken the patient to the operating room for a laparoscopic ventral hernia repair and laparoscopic repair of his recurrent inguinal hernia and has asked me to assist with the inguinal hernia portion of the case.  The risks and benefits including but not limited to bleeding, infection, chronic inguinal pain, nerve entrapment, hernia recurrence, mesh complications, hematoma formation, urinary retention, injury to the testicles or the ovaries, numbness in the groin, blood clots, injury to the surrounding structures, and anesthesia risk was discussed with the patient.  DESCRIPTION OF PROCEDURE: After obtaining verbal consent and marking  the left groin in the holding area with the patient confirming the  operative site, the patient was then taken back to the operating room, placed  supine on the operating room table. General endotracheal anesthesia was  established.  Sequential compression devices were placed. The    abdomen and groin were prepped and draped in the usual standard surgical  fashion with ChloraPrep. The patient received IV  antibiotics prior to the incision. A surgical time-out was performed.   Please see Dr. Fatima Sanger operative note regarding the details of the beginning of the case. I joined him in the operating room after he reduced some of the omental fat in the ventral hernia. He had already placed 3 abdominal wall trochars in the left abdomen laterally. The patient had evidence of recurrent left indirect inguinal hernia. There is a defect lateral to the inferior epigastric vessels. Placed a 5 mm trocar in the right abdominal wall lateral to the midclavicular line at the level of umbilicus. I also placed a 5 mm trocar in the midline slightly below the level of the umbilicus. These were placed under direct visualization after local had been infiltrated. There was no evidence of the contralateral defect.    I then made an incision along the peritoneum on the left, starting 2 inches above  the anterior superior iliac spine and caring it medial  toward the median umbilical ligament in a lazy S configuration using  Endo Shears with electrocautery. The peritoneal flap was then gently  dissected downward from the anterior abdominal wall taking care not to  injure the inferior epigastric vessels. The pubic bone was identified.  The testicular vessels were identified.  Using  traction and counter traction with short graspers, I reduced the sac in  its entirety. The testicular vessels had been identified and preserved. The vas deferens was identified and preserved, and the hernia sac was stripped from those to  surrounding structures. I then  went about creating a large pocket by  lifting the peritoneum of the pelvic floor. I took great care not to  injure the iliac vessels. Medial to the inferior epigastric vessels on left there was evidence of the old plug in the correct position. It did not appear  to have migrated. There was no obvious direct defect. The direct space was well covered by the plug. I elected to leave it alone.  I then obtained a piece of 4 x 6 in Bard 3DMax lightmesh, placed it through the left sided 11mm trocar; half of it covered medial  to the inferior epigastric vessels and half of it lateral to the  inferior epigastric vessels. The defect was well  covered with the mesh. I then secured the mesh to the abdominal wall  using an Ethicon secure strap tack with Dr Fatima Sanger assistance. Tacks were placed through  the Cooper's ligament, one tack on each side of the inferior epigastric  vessel and two tacks out laterally. No tacks were placed below the  shelving edge of the inguinal ligament. Pneumoperitoneum was reduced  to 8 mmHg. I then brought the peritoneal flap back up to the abdominal  wall and tacked it to the abdominal wall using 4 tacks.  At this point, Dr Corliss Skains continued the rest of the case regarding the ventral hernias. Please see his operative note for further details. There were no immediate complications during my portion of the case.   Mary Sella. Andrey Campanile, MD, FACS General, Bariatric, & Minimally Invasive Surgery [facility], [place]

## 2011-10-20 NOTE — Anesthesia Postprocedure Evaluation (Signed)
Anesthesia Post Note  Patient: Joel Lawrence  Procedure(s) Performed: Procedure(s) (LRB): LAPAROSCOPIC VENTRAL HERNIA (N/A) LAPAROSCOPIC INGUINAL HERNIA (Left) INSERTION OF MESH (N/A) RECURRENT HERNIA (Left)  Anesthesia type: General  Patient location: PACU  Post pain: Pain level controlled  Post assessment: Post-op Vital signs reviewed  Last Vitals:  Filed Vitals:   10/20/11 1500  BP: 144/72  Pulse: 71  Temp:   Resp: 16    Post vital signs: Reviewed  Level of consciousness: sedated  Complications: No apparent anesthesia complications

## 2011-10-20 NOTE — Anesthesia Preprocedure Evaluation (Signed)
Anesthesia Evaluation  Patient identified by MRN, date of birth, ID band Patient awake    Reviewed: Allergy & Precautions, H&P , NPO status , Patient's Chart, lab work & pertinent test results, reviewed documented beta blocker date and time   History of Anesthesia Complications (+) PONV  Airway Mallampati: II TM Distance: >3 FB Neck ROM: Full    Dental  (+) Dental Advisory Given and Teeth Intact   Pulmonary neg pulmonary ROS,  breath sounds clear to auscultation        Cardiovascular negative cardio ROS  Rhythm:Regular Rate:Normal  Denies cardiac symptoms   Neuro/Psych negative neurological ROS  negative psych ROS   GI/Hepatic negative GI ROS, Neg liver ROS,   Endo/Other  negative endocrine ROS  Renal/GU negative Renal ROS  negative genitourinary   Musculoskeletal   Abdominal   Peds negative pediatric ROS (+)  Hematology negative hematology ROS (+)   Anesthesia Other Findings Upper front cap  Reproductive/Obstetrics negative OB ROS                           Anesthesia Physical Anesthesia Plan  ASA: I  Anesthesia Plan: General   Post-op Pain Management:    Induction: Intravenous  Airway Management Planned: Oral ETT  Additional Equipment:   Intra-op Plan:   Post-operative Plan: Extubation in OR  Informed Consent: I have reviewed the patients History and Physical, chart, labs and discussed the procedure including the risks, benefits and alternatives for the proposed anesthesia with the patient or authorized representative who has indicated his/her understanding and acceptance.   Dental advisory given  Plan Discussed with: CRNA and Surgeon  Anesthesia Plan Comments:         Anesthesia Quick Evaluation

## 2011-10-21 MED ORDER — SALINE SPRAY 0.65 % NA SOLN
1.0000 | NASAL | Status: DC | PRN
  Filled 2011-10-21: qty 44

## 2011-10-21 MED ORDER — OXYCODONE-ACETAMINOPHEN 5-325 MG PO TABS: 1.0000 | ORAL_TABLET | ORAL | Status: AC | PRN

## 2011-10-21 NOTE — Progress Notes (Signed)
1 Day Post-Op  Subjective: Sore, but resting comfortably Voiding without difficulty   Objective: Vital signs in last 24 hours: Temp:  [97.1 F (36.2 C)-98.4 F (36.9 C)] 98.2 F (36.8 C) (03/28 0612) Pulse Rate:  [60-101] 70  (03/28 0612) Resp:  [11-20] 16  (03/28 0612) BP: (106-169)/(50-97) 110/68 mmHg (03/28 0612) SpO2:  [94 %-100 %] 98 % (03/28 0612) Weight:  [227 lb 3.2 oz (103.057 kg)] 227 lb 3.2 oz (103.057 kg) (03/27 1939) Last BM Date: 10/19/11  Intake/Output from previous day: 03/27 0701 - 03/28 0700 In: 4480 [P.O.:580; I.V.:3900] Out: 2075 [Urine:2025; Blood:50] Intake/Output this shift: Total I/O In: 1260 [P.O.:360; I.V.:900] Out: 1525 [Urine:1525]  Abd - soft; wounds dry; minimal bruising.  Lab Results:   Basename 10/20/11 1824 10/18/11 1335  WBC 10.4 4.7  HGB 14.5 16.0  HCT 42.6 47.1  PLT 193 224   BMET  Basename 10/20/11 1824  NA --  K --  CL --  CO2 --  GLUCOSE --  BUN --  CREATININE 1.15  CALCIUM --   PT/INR No results found for this basename: LABPROT:2,INR:2 in the last 72 hours ABG No results found for this basename: PHART:2,PCO2:2,PO2:2,HCO3:2 in the last 72 hours  Studies/Results: No results found.  Anti-infectives: Anti-infectives     Start     Dose/Rate Route Frequency Ordered Stop   10/20/11 1800   ceFAZolin (ANCEF) IVPB 1 g/50 mL premix        1 g 100 mL/hr over 30 Minutes Intravenous Every 6 hours 10/20/11 1710 10/21/11 0615   10/20/11 0939   ceFAZolin (ANCEF) IVPB 2 g/50 mL premix        2 g 100 mL/hr over 30 Minutes Intravenous 60 min pre-op 10/20/11 0939 10/20/11 1205          Assessment/Plan: s/p Procedure(s) (LRB): LAPAROSCOPIC VENTRAL HERNIA (N/A) LAPAROSCOPIC INGUINAL HERNIA (Left) INSERTION OF MESH (N/A) RECURRENT HERNIA (Left) Discharge  LOS: 1 day    Jeffifer Rabold K. 10/21/2011

## 2011-10-21 NOTE — Discharge Instructions (Signed)
[facility]  UMBILICAL/  INGUINAL HERNIA REPAIR: POST OP INSTRUCTIONS  Always review your discharge instruction sheet given to you by the facility where your surgery was performed. IF YOU HAVE DISABILITY OR FAMILY LEAVE FORMS, YOU MUST BRING THEM TO THE OFFICE FOR PROCESSING.   DO NOT GIVE THEM TO YOUR DOCTOR.  1. A  prescription for pain medication may be given to you upon discharge.  Take your pain medication as prescribed, if needed.  If narcotic pain medicine is not needed, then you may take acetaminophen (Tylenol) or ibuprofen (Advil) as needed. 2. Take your usually prescribed medications unless otherwise directed. 3. If you need a refill on your pain medication, please contact your pharmacy.  They will contact our office to request authorization. Prescriptions will not be filled after 5 pm or on week-ends. 4. You should follow a light diet the first 24 hours after arrival home, such as soup and crackers, etc.  Be sure to include lots of fluids daily.  Resume your normal diet the day after surgery. 5. Most patients will experience some swelling and bruising around the umbilicus or in the groin and scrotum.  Ice packs and reclining will help.  Swelling and bruising can take several days to resolve.  6. It is common to experience some constipation if taking pain medication after surgery.  Increasing fluid intake and taking a stool softener (such as Colace) will usually help or prevent this problem from occurring.  A mild laxative (Milk of Magnesia or Miralax) should be taken according to package directions if there are no bowel movements after 48 hours. 7. Unless discharge instructions indicate otherwise, you may shower 24 hours after surgery.  The skin glue will slowly flake off over the next week or two. 8. ACTIVITIES:  You may resume regular (light) daily activities beginning the next day--such as daily self-care, walking, climbing stairs--gradually increasing activities as  tolerated.  You may have sexual intercourse when it is comfortable.  Refrain from any heavy lifting or straining until approved by your doctor. a. You may drive when you are no longer taking prescription pain medication, you can comfortably wear a seatbelt, and you can safely maneuver your car and apply brakes. b. RETURN TO WORK:  2-3 weeks with light duty - no lifting over 15 lbs. 9. You should see your doctor in the office for a follow-up appointment approximately 2-3 weeks after your surgery.  Make sure that you call for this appointment within a day or two after you arrive home to insure a convenient appointment time. 10. OTHER INSTRUCTIONS:  __________________________________________________________________________________________________________________________________________________________________________________________  WHEN TO CALL YOUR DOCTOR: 1. Fever over 101.0 2. Inability to urinate 3. Nausea and/or vomiting 4. Extreme swelling or bruising 5. Continued bleeding from incisions. 6. Increased pain, redness, or drainage from the incisions  The clinic staff is available to answer your questions during regular business hours.  Please don't hesitate to call and ask to speak to one of the nurses for clinical concerns.  If you have a medical emergency, go to the nearest emergency room or call 911.  A surgeon from [facility] is always on call at the hospital   [address], [place], [place]  [address] ?  [address] [address], [place], [place]   [address] [phone]    FAX [phone] Web site: [email or web address]

## 2011-10-21 NOTE — Discharge Summary (Signed)
Physician Discharge Summary  Patient ID: Joel Lawrence MRN: [id number] DOB/AGE: [birth year] 51 y.o.  Admit date: 10/20/2011 Discharge date: 10/21/2011  Admission Diagnoses: 1.  Ventral hernia x 2 2.  Recurrent left inguinal hernia - indirect  Discharge Diagnoses:  Same   Active Problems:  * No active hospital problems. *    Discharged Condition: good  Hospital Course: Laparoscopic ventral hernia repair with mesh/ recurrent inguinal hernia repair with mesh  Consults: None  Significant Diagnostic Studies: none   Treatments: surgery: as above  Discharge Exam: Blood pressure 110/68, pulse 70, temperature 98.2 F (36.8 C), temperature source Oral, resp. rate 16, height 5\' 11"  (1.803 m), weight 227 lb 3.2 oz (103.057 kg), SpO2 98.00%. incisions sealed with Dermabond; dry; no bruising  Disposition: 01-Home or Self Care  Discharge Orders    Future Orders Please Complete By Expires   Diet general      Increase activity slowly      May walk up steps      May shower / Bathe      Driving Restrictions      Comments:   Do not drive while taking pain medications   Call MD for:  temperature >100.4      Call MD for:  persistant nausea and vomiting      Call MD for:  severe uncontrolled pain      Call MD for:  redness, tenderness, or signs of infection (pain, swelling, redness, odor or green/yellow discharge around incision site)        Medication List  As of 10/21/2011  7:02 AM   TAKE these medications         NON FORMULARY   as needed. Workout liquid supplement called [employer] [employer] [facility] [employer] [facility].      OMEGA 3 PO   Take 3 capsules by mouth 2 (two) times daily.      OVER THE COUNTER MEDICATION   Take 1 capsule by mouth daily. Testosterone Booster from [facility]  Hold while in hospital      oxyCODONE-acetaminophen 5-325 MG per tablet   Commonly known as: PERCOCET   Take 1 tablet by mouth every 4 (four) hours as needed.           Follow-up Information    Follow up with Verina Galeno  K., MD. Schedule an appointment as soon as possible for a visit in 3 weeks.   Contact information:   [employer] [facility] [address] [place] [address] [phone]          Signed: Wynona Luna. 10/21/2011, 7:02 AM

## 2011-10-22 ENCOUNTER — Encounter (HOSPITAL_COMMUNITY): Payer: Self-pay | Admitting: Surgery

## 2011-10-25 ENCOUNTER — Telehealth (INDEPENDENT_AMBULATORY_CARE_PROVIDER_SITE_OTHER): Payer: Self-pay | Admitting: General Surgery

## 2011-10-25 NOTE — Telephone Encounter (Signed)
Pt calling in to inquire how long he should wear the large elastic belt (following his ventral hernia surgery repair.)  He was instructed to wear it per his own comfort and security.

## 2011-11-05 ENCOUNTER — Encounter (INDEPENDENT_AMBULATORY_CARE_PROVIDER_SITE_OTHER): Payer: Self-pay | Admitting: Surgery

## 2011-11-05 ENCOUNTER — Ambulatory Visit (INDEPENDENT_AMBULATORY_CARE_PROVIDER_SITE_OTHER): Payer: BC Managed Care – PPO | Admitting: Surgery

## 2011-11-05 VITALS — BP 136/70 | HR 72 | Temp 97.8°F | Resp 18 | Ht 72.0 in | Wt 230.4 lb

## 2011-11-05 DIAGNOSIS — K439 Ventral hernia without obstruction or gangrene: Secondary | ICD-10-CM

## 2011-11-05 DIAGNOSIS — K4091 Unilateral inguinal hernia, without obstruction or gangrene, recurrent: Secondary | ICD-10-CM

## 2011-11-05 NOTE — Progress Notes (Signed)
Status post laparoscopic ventral hernia repair with mesh and laparoscopic repair of a recurrent left inguinal hernia with mesh via a TAP approach. The patient is doing reasonably well. He still has some soreness in his left groin. He gets occasional twinges of pain around his periumbilical hernia. He had 2 small defects at his umbilicus and above his umbilicus. He also had a recurrent indirect inguinal hernia in the left. He is now 2 weeks postop. His incisions are all well healed with no sign of infection. The Dermabond is coming off of the incisions. No sign of recurrent hernia at either location. Most of the soreness is along the inguinal ligament on the left. I encouraged him to use nonsteroidal anti-inflammatory medications as well as an abdominal binder. The soreness should slowly resolve over time. Recheck in 2 weeks prior to releasing him to full duty.  Joel Lawrence. Corliss Skains, MD, [facility]  11/05/2011 3:11 PM

## 2011-11-30 ENCOUNTER — Ambulatory Visit (INDEPENDENT_AMBULATORY_CARE_PROVIDER_SITE_OTHER): Payer: BC Managed Care – PPO | Admitting: Surgery

## 2011-11-30 ENCOUNTER — Encounter (INDEPENDENT_AMBULATORY_CARE_PROVIDER_SITE_OTHER): Payer: Self-pay | Admitting: Surgery

## 2011-11-30 VITALS — BP 136/80 | HR 68 | Temp 97.5°F | Resp 18 | Ht 72.0 in | Wt 235.2 lb

## 2011-11-30 DIAGNOSIS — K4091 Unilateral inguinal hernia, without obstruction or gangrene, recurrent: Secondary | ICD-10-CM

## 2011-11-30 DIAGNOSIS — K439 Ventral hernia without obstruction or gangrene: Secondary | ICD-10-CM

## 2011-11-30 NOTE — Progress Notes (Signed)
Follow-up after laparoscopic repair of recurrent left inguinal hernia and ventral hernia on 10/20/11.  The patient is doing quite well.  The soreness is essentially resolved.  His abdomen is flat with no sign of seroma/ hematoma/ recurrence at his ventral hernia.  He has a firm knot of scar tissue in his left groin, but this does not move with Valsalva maneuver.  It will likely take some time for this scar tissue to soften, but he may resume full activity.    Return to full activity.  Follow-up PRN.  Wilmon Arms. Corliss Skains, MD, [facility]  11/30/2011 9:54 PM

## 2014-02-26 ENCOUNTER — Encounter (HOSPITAL_COMMUNITY): Payer: Self-pay | Admitting: Emergency Medicine

## 2014-02-26 ENCOUNTER — Emergency Department (HOSPITAL_COMMUNITY)
Admission: EM | Admit: 2014-02-26 | Discharge: 2014-02-26 | Disposition: A | Discharge status: Home / Self Care | Payer: 59 | Attending: Emergency Medicine | Admitting: Emergency Medicine

## 2014-02-26 ENCOUNTER — Emergency Department (HOSPITAL_COMMUNITY): Payer: 59

## 2014-02-26 DIAGNOSIS — K4031 Unilateral inguinal hernia, with obstruction, without gangrene, recurrent: Secondary | ICD-10-CM

## 2014-02-26 DIAGNOSIS — K4090 Unilateral inguinal hernia, without obstruction or gangrene, not specified as recurrent: Secondary | ICD-10-CM | POA: Insufficient documentation

## 2014-02-26 DIAGNOSIS — K4091 Unilateral inguinal hernia, without obstruction or gangrene, recurrent: Secondary | ICD-10-CM

## 2014-02-26 DIAGNOSIS — Z87891 Personal history of nicotine dependence: Secondary | ICD-10-CM | POA: Insufficient documentation

## 2014-02-26 DIAGNOSIS — K4030 Unilateral inguinal hernia, with obstruction, without gangrene, not specified as recurrent: Secondary | ICD-10-CM | POA: Insufficient documentation

## 2014-02-26 DIAGNOSIS — Z79899 Other long term (current) drug therapy: Secondary | ICD-10-CM | POA: Insufficient documentation

## 2014-02-26 DIAGNOSIS — R109 Unspecified abdominal pain: Secondary | ICD-10-CM | POA: Insufficient documentation

## 2014-02-26 LAB — URINALYSIS, ROUTINE W REFLEX MICROSCOPIC
Bilirubin Urine: NEGATIVE
GLUCOSE, UA: NEGATIVE mg/dL
HGB URINE DIPSTICK: NEGATIVE
Ketones, ur: NEGATIVE mg/dL
Leukocytes, UA: NEGATIVE
Nitrite: NEGATIVE
Protein, ur: NEGATIVE mg/dL
SPECIFIC GRAVITY, URINE: 1.023 (ref 1.005–1.030)
UROBILINOGEN UA: 0.2 mg/dL (ref 0.0–1.0)
pH: 6 (ref 5.0–8.0)

## 2014-02-26 MED ORDER — MORPHINE SULFATE 4 MG/ML IJ SOLN
4.0000 mg | Freq: Once | INTRAMUSCULAR | Status: AC
  Administered 2014-02-26: 4 mg via INTRAVENOUS
  Filled 2014-02-26: qty 1

## 2014-02-26 MED ORDER — OXYCODONE-ACETAMINOPHEN 5-325 MG PO TABS: 1.0000 | ORAL_TABLET | ORAL | Status: DC | PRN

## 2014-02-26 NOTE — ED Provider Notes (Signed)
CSN: [id number]     Arrival date & time 02/26/14  0227 History   First MD Initiated Contact with Patient 02/26/14 [phone]     Chief Complaint  Patient presents with  . Groin Pain     (Consider location/radiation/quality/duration/timing/severity/associated sxs/prior Treatment) Patient is a 53 y.o. male presenting with groin pain. The history is provided by the patient.  Groin Pain  He has had 3 surgeries on a left inguinal hernia. Over the last 2 weeks, he has had pain in the left inguinal area which is where it was related to recurrence of his hernia. It got much worse yesterday and radiated up to his left eye. He rated the pain at 8/10. Also come he has noted a lump on the right side in the inguinal area which is worried may be a new hernia. He denies fever, chills, sweats. Denies nausea vomiting. Denies dysuria. He has not taken anything for pain.  Past Medical History  Diagnosis Date  . Chest pain   . PONV (postoperative nausea and vomiting)   . GERD (gastroesophageal reflux disease)   . Seizures     hx of as a child no problems now    Past Surgical History  Procedure Laterality Date  . Hernia repair  09/01/2007    Left Inguinal hernia  . Hernia repair  07/06/2010    recurrent left inguinal hernia  . Ventral hernia repair  10/20/2011    Procedure: LAPAROSCOPIC VENTRAL HERNIA;  Surgeon: Wilmon ArmsMatthew K. Corliss Skainssuei, MD;  Location: [facility];  Service: General;  Laterality: N/A;  . Inguinal hernia repair  10/20/2011    Procedure: LAPAROSCOPIC INGUINAL HERNIA;  Surgeon: Wilmon ArmsMatthew K. Corliss Skainssuei, MD;  Location: [facility];  Service: General;  Laterality: Left;  recurrent   Family History  Problem Relation Age of Onset  . Cancer Father     lung  . Heart disease Father    History  Substance Use Topics  . Smoking status: Former Smoker    Quit date: 07/26/1978  . Smokeless tobacco: Never Used  . Alcohol Use: No    Review of Systems  All other systems reviewed and are negative.     Allergies  Review  of patient's allergies indicates no known allergies.  Home Medications   Prior to Admission medications   Medication Sig Start Date End Date Taking? Authorizing Provider  NON FORMULARY as needed. Workout liquid supplement called [employer].    Historical Provider, MD  Omega-3 Fatty Acids (OMEGA 3 PO) Take 3 capsules by mouth 2 (two) times daily.    Historical Provider, MD  OVER THE COUNTER MEDICATION Take 1 capsule by mouth daily. Testosterone Booster from [employer] while in hospital    Historical Provider, MD   BP 137/82  Pulse 45  Temp(Src) 98.8 F (37.1 C) (Oral)  Resp 14  Ht 6' (1.829 m)  Wt 219 lb (99.338 kg)  BMI 29.70 kg/m2  SpO2 99% Physical Exam  Nursing note and vitals reviewed.  .53 year old male, resting comfortably and in no acute distress. Vital signs are significant for bradycardia rate 45. Oxygen saturation is 99%, which is normal. Head is normocephalic and atraumatic. PERRLA, EOMI. Oropharynx is clear. Neck is nontender and supple without adenopathy or JVD. Back is nontender in the midline. There is moderate left CVA tenderness. Lungs are clear without rales, wheezes, or rhonchi. Chest is nontender. Heart has regular rate and rhythm without murmur. Abdomen is soft, flat, nontender without masses or hepatosplenomegaly and peristalsis is normoactive.  Genitalia: Circumcised penis, testes descended without masses. There is a small direct inguinal hernia on the right, which is mildly to moderately tender. The area over the incision where he had his left inguinal hernia repair done is very tender but I cannot definitely feel a hernia protrude. Extremities have no cyanosis or edema, full range of motion is present. Skin is warm and dry without rash. Neurologic: Mental status is normal, cranial nerves are intact, there are no motor or sensory deficits.  ED Course  Procedures (including critical care time) Labs Review Results for orders placed during the hospital  encounter of 02/26/14  URINALYSIS, ROUTINE W REFLEX MICROSCOPIC      Result Value Ref Range   Color, Urine YELLOW  YELLOW   APPearance CLEAR  CLEAR   Specific Gravity, Urine 1.023  1.005 - 1.030   pH 6.0  5.0 - 8.0   Glucose, UA NEGATIVE  NEGATIVE mg/dL   Hgb urine dipstick NEGATIVE  NEGATIVE   Bilirubin Urine NEGATIVE  NEGATIVE   Ketones, ur NEGATIVE  NEGATIVE mg/dL   Protein, ur NEGATIVE  NEGATIVE mg/dL   Urobilinogen, UA 0.2  0.0 - 1.0 mg/dL   Nitrite NEGATIVE  NEGATIVE   Leukocytes, UA NEGATIVE  NEGATIVE   Imaging Review Ct Abdomen Pelvis Wo Contrast  02/26/2014   CLINICAL DATA:  Groin pain.  Kidney stone versus hernia.  EXAM: CT ABDOMEN AND PELVIS WITHOUT CONTRAST  TECHNIQUE: Multidetector CT imaging of the abdomen and pelvis was performed following the standard protocol without IV contrast.  COMPARISON:  09/04/2011  FINDINGS: BODY WALL: Status post left inguinal and umbilical hernia repairs. No definitive recurrence (fat in the left inguinal region may have been entrapped at time of surgery). There is a small fatty right inguinal hernia which is stable from previous, best seen in the sagittal projection.  LOWER CHEST: Unremarkable.  ABDOMEN/PELVIS:  Liver: No focal abnormality.  Biliary: No evidence of biliary obstruction or stone.  Pancreas: Unremarkable.  Spleen: Unremarkable.  Adrenals: Unremarkable.  Kidneys and ureters: No hydronephrosis or stone.  Bladder: Unremarkable.  Reproductive: Unremarkable.  Bowel: No obstruction.  Retroperitoneum: No mass or adenopathy.  Peritoneum: No ascites or pneumoperitoneum.  Vascular: No acute abnormality.  OSSEOUS: No acute abnormalities.  IMPRESSION: 1. No acute findings to explain groin pain.  No urolithiasis. 2. Small fatty right inguinal hernia, stable from 2013.   Electronically Signed   By: Tiburcio Pea M.D.   On: 02/26/2014 06:01    MDM   Final diagnoses:  Recurrent unilateral inguinal hernia with obstruction and without gangrene   Unilateral recurrent inguinal hernia without obstruction or gangrene    Left inguinal pain which is probably related to recurrent inguinal hernia. However, with pain radiating to her flank and CVA tenderness, he will be sent for a CT scan to rule out urolithiasis. He also has a new inguinal hernia on the right. Old records are reviewed showing surgery for recurrent hernia and CT from 2013 showed no evidence of renal calculi at that time.  CT shows no evidence of renal or ureteral calculi. Right inguinal hernia is noted and have been present previously. There is no evidence of incarceration. Patient is advised of these findings and is referred back to his surgeon for followup. He is discharged with prescription for oxycodone-acetaminophen.  Dione Booze, MD 02/26/14 [phone]

## 2014-02-26 NOTE — Discharge Instructions (Signed)
Inguinal Hernia, Adult °Muscles help keep everything in the body in its proper place. But if a weak spot in the muscles develops, something can poke through. That is called a hernia. When this happens in the lower part of the belly (abdomen), it is called an inguinal hernia. (It takes its name from a part of the body in this region called the inguinal canal.) A weak spot in the wall of muscles lets some fat or part of the small intestine bulge through. An inguinal hernia can develop at any age. Men get them more often than women. °CAUSES  °In adults, an inguinal hernia develops over time. °· It can be triggered by: °¨ Suddenly straining the muscles of the lower abdomen. °¨ Lifting heavy objects. °¨ Straining to have a bowel movement. Difficult bowel movements (constipation) can lead to this. °¨ Constant coughing. This may be caused by smoking or lung disease. °¨ Being overweight. °¨ Being pregnant. °¨ Working at a job that requires long periods of standing or heavy lifting. °¨ Having had an inguinal hernia before. °One type can be an emergency situation. It is called a strangulated inguinal hernia. It develops if part of the small intestine slips through the weak spot and cannot get back into the abdomen. The blood supply can be cut off. If that happens, part of the intestine may die. This situation requires emergency surgery. °SYMPTOMS  °Often, a small inguinal hernia has no symptoms. It is found when a healthcare provider does a physical exam. Larger hernias usually have symptoms.  °· In adults, symptoms may include: °¨ A lump in the groin. This is easier to see when the person is standing. It might disappear when lying down. °¨ In men, a lump in the scrotum. °¨ Pain or burning in the groin. This occurs especially when lifting, straining or coughing. °¨ A dull ache or feeling of pressure in the groin. °· Signs of a strangulated hernia can include: °¨ A bulge in the groin that becomes very painful and tender to the  touch. °¨ A bulge that turns red or purple. °¨ Fever, nausea and vomiting. °¨ Inability to have a bowel movement or to pass gas. °DIAGNOSIS  °To decide if you have an inguinal hernia, a healthcare provider will probably do a physical examination. °· This will include asking questions about any symptoms you have noticed. °· The healthcare provider might feel the groin area and ask you to cough. If an inguinal hernia is felt, the healthcare provider may try to slide it back into the abdomen. °· Usually no other tests are needed. °TREATMENT  °Treatments can vary. The size of the hernia makes a difference. Options include: °· Watchful waiting. This is often suggested if the hernia is small and you have had no symptoms. °¨ No medical procedure will be done unless symptoms develop. °¨ You will need to watch closely for symptoms. If any occur, contact your healthcare provider right away. °· Surgery. This is used if the hernia is larger or you have symptoms. °¨ Open surgery. This is usually an outpatient procedure (you will not stay overnight in a hospital). An cut (incision) is made through the skin in the groin. The hernia is put back inside the abdomen. The weak area in the muscles is then repaired by herniorrhaphy or hernioplasty. Herniorrhaphy: in this type of surgery, the weak muscles are sewn back together. Hernioplasty: a patch or mesh is used to close the weak area in the abdominal wall. °¨ Laparoscopy.   In this procedure, a surgeon makes small incisions. A thin tube with a tiny video camera (called a laparoscope) is put into the abdomen. The surgeon repairs the hernia with mesh by looking with the video camera and using two long instruments. HOME CARE INSTRUCTIONS   After surgery to repair an inguinal hernia:  You will need to take pain medicine prescribed by your healthcare provider. Follow all directions carefully.  You will need to take care of the wound from the incision.  Your activity will be  restricted for awhile. This will probably include no heavy lifting for several weeks. You also should not do anything too active for a few weeks. When you can return to work will depend on the type of job that you have.  During "watchful waiting" periods, you should:  Maintain a healthy weight.  Eat a diet high in fiber (fruits, vegetables and whole grains).  Drink plenty of fluids to avoid constipation. This means drinking enough water and other liquids to keep your urine clear or pale yellow.  Do not lift heavy objects.  Do not stand for long periods of time.  Quit smoking. This should keep you from developing a frequent cough. SEEK MEDICAL CARE IF:   A bulge develops in your groin area.  You feel pain, a burning sensation or pressure in the groin. This might be worse if you are lifting or straining.  You develop a fever of more than 100.5 F (38.1 C). SEEK IMMEDIATE MEDICAL CARE IF:   Pain in the groin increases suddenly.  A bulge in the groin gets bigger suddenly and does not go down.  For men, there is sudden pain in the scrotum. Or, the size of the scrotum increases.  A bulge in the groin area becomes red or purple and is painful to touch.  You have nausea or vomiting that does not go away.  You feel your heart beating much faster than normal.  You cannot have a bowel movement or pass gas.  You develop a fever of more than 102.0 F (38.9 C). Document Released: 11/28/2008 Document Revised: 10/04/2011 Document Reviewed: 11/28/2008 [facility] Patient Information 2015 [place], [place]. This information is not intended to replace advice given to you by your health care provider. Make sure you discuss any questions you have with your health care provider.  Acetaminophen; Oxycodone tablets What is this medicine? ACETAMINOPHEN; OXYCODONE (a set a MEE noe fen; ox i KOE done) is a pain reliever. It is used to treat mild to moderate pain. This medicine may be used for other  purposes; ask your health care provider or pharmacist if you have questions. COMMON BRAND NAME(S): Endocet, Magnacet, Narvox, Percocet, Perloxx, Primalev, Primlev, Roxicet, Xolox What should I tell my health care provider before I take this medicine? They need to know if you have any of these conditions: -brain tumor -Crohn's disease, inflammatory bowel disease, or ulcerative colitis -drug abuse or addiction -head injury -heart or circulation problems -if you often drink alcohol -kidney disease or problems going to the bathroom -liver disease -lung disease, asthma, or breathing problems -an unusual or allergic reaction to acetaminophen, oxycodone, other opioid analgesics, other medicines, foods, dyes, or preservatives -pregnant or trying to get pregnant -breast-feeding How should I use this medicine? Take this medicine by mouth with a full glass of water. Follow the directions on the prescription label. Take your medicine at regular intervals. Do not take your medicine more often than directed. Talk to your pediatrician regarding the use  of this medicine in children. Special care may be needed. Patients over 55 years old may have a stronger reaction and need a smaller dose. Overdosage: If you think you have taken too much of this medicine contact a poison control center or emergency room at once. NOTE: This medicine is only for you. Do not share this medicine with others. What if I miss a dose? If you miss a dose, take it as soon as you can. If it is almost time for your next dose, take only that dose. Do not take double or extra doses. What may interact with this medicine? -alcohol -antihistamines -barbiturates like amobarbital, butalbital, butabarbital, methohexital, pentobarbital, phenobarbital, thiopental, and secobarbital -benztropine -drugs for bladder problems like solifenacin, trospium, oxybutynin, tolterodine, hyoscyamine, and methscopolamine -drugs for breathing problems like  ipratropium and tiotropium -drugs for certain stomach or intestine problems like propantheline, homatropine methylbromide, glycopyrrolate, atropine, belladonna, and dicyclomine -general anesthetics like etomidate, ketamine, nitrous oxide, propofol, desflurane, enflurane, halothane, isoflurane, and sevoflurane -medicines for depression, anxiety, or psychotic disturbances -medicines for sleep -muscle relaxants -naltrexone -narcotic medicines (opiates) for pain -phenothiazines like perphenazine, thioridazine, chlorpromazine, mesoridazine, fluphenazine, prochlorperazine, promazine, and trifluoperazine -scopolamine -tramadol -trihexyphenidyl This list may not describe all possible interactions. Give your health care provider a list of all the medicines, herbs, non-prescription drugs, or dietary supplements you use. Also tell them if you smoke, drink alcohol, or use illegal drugs. Some items may interact with your medicine. What should I watch for while using this medicine? Tell your doctor or health care professional if your pain does not go away, if it gets worse, or if you have new or a different type of pain. You may develop tolerance to the medicine. Tolerance means that you will need a higher dose of the medication for pain relief. Tolerance is normal and is expected if you take this medicine for a long time. Do not suddenly stop taking your medicine because you may develop a severe reaction. Your body becomes used to the medicine. This does NOT mean you are addicted. Addiction is a behavior related to getting and using a drug for a non-medical reason. If you have pain, you have a medical reason to take pain medicine. Your doctor will tell you how much medicine to take. If your doctor wants you to stop the medicine, the dose will be slowly lowered over time to avoid any side effects. You may get drowsy or dizzy. Do not drive, use machinery, or do anything that needs mental alertness until you know  how this medicine affects you. Do not stand or sit up quickly, especially if you are an older patient. This reduces the risk of dizzy or fainting spells. Alcohol may interfere with the effect of this medicine. Avoid alcoholic drinks. There are different types of narcotic medicines (opiates) for pain. If you take more than one type at the same time, you may have more side effects. Give your health care provider a list of all medicines you use. Your doctor will tell you how much medicine to take. Do not take more medicine than directed. Call emergency for help if you have problems breathing. The medicine will cause constipation. Try to have a bowel movement at least every 2 to 3 days. If you do not have a bowel movement for 3 days, call your doctor or health care professional. Do not take Tylenol (acetaminophen) or medicines that have acetaminophen with this medicine. Too much acetaminophen can be very dangerous. Many nonprescription medicines contain acetaminophen. Always  read the labels carefully to avoid taking more acetaminophen. What side effects may I notice from receiving this medicine? Side effects that you should report to your doctor or health care professional as soon as possible: -allergic reactions like skin rash, itching or hives, swelling of the face, lips, or tongue -breathing difficulties, wheezing -confusion -light headedness or fainting spells -severe stomach pain -unusually weak or tired -yellowing of the skin or the whites of the eyes Side effects that usually do not require medical attention (report to your doctor or health care professional if they continue or are bothersome): -dizziness -drowsiness -nausea -vomiting This list may not describe all possible side effects. Call your doctor for medical advice about side effects. You may report side effects to FDA at 1-800-FDA-1088. Where should I keep my medicine? Keep out of the reach of children. This medicine can be abused.  Keep your medicine in a safe place to protect it from theft. Do not share this medicine with anyone. Selling or giving away this medicine is dangerous and against the law. Store at room temperature between 20 and 25 degrees C (68 and 77 degrees F). Keep container tightly closed. Protect from light. This medicine may cause accidental overdose and death if it is taken by other adults, children, or pets. Flush any unused medicine down the toilet to reduce the chance of harm. Do not use the medicine after the expiration date. NOTE: This sheet is a summary. It may not cover all possible information. If you have questions about this medicine, talk to your doctor, pharmacist, or health care provider.  2015, [employer]. (2013-03-05 13:17:35)

## 2014-02-26 NOTE — ED Notes (Signed)
Pt. reports pain at left groin onset yesterday afternoon , denies injury or fall , pt. stated history of hernia repair , no urinary discomfort.

## 2014-03-18 ENCOUNTER — Encounter (INDEPENDENT_AMBULATORY_CARE_PROVIDER_SITE_OTHER): Payer: Self-pay | Admitting: Surgery

## 2014-03-18 ENCOUNTER — Ambulatory Visit (INDEPENDENT_AMBULATORY_CARE_PROVIDER_SITE_OTHER): Payer: PRIVATE HEALTH INSURANCE | Admitting: Surgery

## 2014-03-18 VITALS — BP 154/70 | HR 95 | Temp 98.0°F | Ht 71.5 in | Wt 220.5 lb

## 2014-03-18 DIAGNOSIS — K4090 Unilateral inguinal hernia, without obstruction or gangrene, not specified as recurrent: Secondary | ICD-10-CM | POA: Insufficient documentation

## 2014-03-18 NOTE — Progress Notes (Signed)
Patient ID: Joel Lawrence, male   DOB: [birth year], 53 y.o.   MRN: [id number]  Chief Complaint  Patient presents with  . recurrent ing hernia    HPI Joel Lawrence is a 53 y.o. male.  Former patient - new problem  HPI This is a 53 year old male in good health who has had multiple previous hernia repairs. He underwent left inguinal hernia repair 2009 and had a recurrence in 2011.  Subsequently, he developed a ventral hernia, as well as another recurrent left inguinal hernia, repaired laparoscopically in 2013.  The patient works out vigorously with very large amounts of weight. He has developed pain and swelling in his right groin with intermittent radiation across to his left groin. He was evaluated in the emergency department and was found to have a right inguinal hernia. He presents now to discuss excision. Past Medical History  Diagnosis Date  . Chest pain   . PONV (postoperative nausea and vomiting)   . GERD (gastroesophageal reflux disease)   . Seizures     hx of as a child no problems now     Past Surgical History  Procedure Laterality Date  . Hernia repair  09/01/2007    Left Inguinal hernia  . Hernia repair  07/06/2010    recurrent left inguinal hernia  . Ventral hernia repair  10/20/2011    Procedure: LAPAROSCOPIC VENTRAL HERNIA;  Surgeon: Wilmon Arms. Corliss Skains, MD;  Location: [facility];  Service: General;  Laterality: N/A;  . Inguinal hernia repair  10/20/2011    Procedure: LAPAROSCOPIC INGUINAL HERNIA;  Surgeon: Wilmon Arms. Corliss Skains, MD;  Location: [facility];  Service: General;  Laterality: Left;  recurrent    Family History  Problem Relation Age of Onset  . Cancer Father     lung  . Heart disease Father     Social History History  Substance Use Topics  . Smoking status: Former Smoker    Quit date: 07/26/1978  . Smokeless tobacco: Never Used  . Alcohol Use: No    Allergies  Allergen Reactions  . Other     States that some ointments and deodorant cause him to have a rash     Current Outpatient Prescriptions  Medication Sig Dispense Refill  . Omega-3 Fatty Acids (OMEGA 3 PO) Take 3 capsules by mouth 2 (two) times daily.      Marland Kitchen testosterone cypionate (DEPOTESTOTERONE CYPIONATE) 200 MG/ML injection Inject 100 mg into the muscle every 7 (seven) days.        No current facility-administered medications for this visit.    Review of Systems Review of Systems  Constitutional: Negative for fever, chills and unexpected weight change.  HENT: Negative for congestion, hearing loss, sore throat, trouble swallowing and voice change.   Eyes: Negative for visual disturbance.  Respiratory: Negative for cough and wheezing.   Cardiovascular: Negative for chest pain, palpitations and leg swelling.  Gastrointestinal: Negative for nausea, vomiting, abdominal pain, diarrhea, constipation, blood in stool, abdominal distention, anal bleeding and rectal pain.  Genitourinary: Positive for testicular pain. Negative for hematuria and difficulty urinating.  Musculoskeletal: Negative for arthralgias.  Skin: Negative for rash and wound.  Neurological: Negative for seizures, syncope, weakness and headaches.  Hematological: Negative for adenopathy. Does not bruise/bleed easily.  Psychiatric/Behavioral: Negative for confusion.    Blood pressure 154/70, pulse 95, temperature 98 F (36.7 C), temperature source Oral, height 5' 11.5" (1.816 m), weight 220 lb 8 oz (100.018 kg).  Physical Exam Physical Exam WDWN in NAD HEENT:  EOMI, sclera  anicteric Neck:  No masses, no thyromegaly Lungs:  CTA bilaterally; normal respiratory effort CV:  Regular rate and rhythm; no murmurs Abd:  +bowel sounds, soft, non-tender, no masses; no ventral hernia GU:  No sign of left inguinal hernia; reducible right inguinal hernia Ext:  Well-perfused; no edema Skin:  Warm, dry; no sign of jaundice  Data Reviewed Ct Abdomen Pelvis Wo Contrast  02/26/2014   CLINICAL DATA:  Groin pain.  Kidney stone versus  hernia.  EXAM: CT ABDOMEN AND PELVIS WITHOUT CONTRAST  TECHNIQUE: Multidetector CT imaging of the abdomen and pelvis was performed following the standard protocol without IV contrast.  COMPARISON:  09/04/2011  FINDINGS: BODY WALL: Status post left inguinal and umbilical hernia repairs. No definitive recurrence (fat in the left inguinal region may have been entrapped at time of surgery). There is a small fatty right inguinal hernia which is stable from previous, best seen in the sagittal projection.  LOWER CHEST: Unremarkable.  ABDOMEN/PELVIS:  Liver: No focal abnormality.  Biliary: No evidence of biliary obstruction or stone.  Pancreas: Unremarkable.  Spleen: Unremarkable.  Adrenals: Unremarkable.  Kidneys and ureters: No hydronephrosis or stone.  Bladder: Unremarkable.  Reproductive: Unremarkable.  Bowel: No obstruction.  Retroperitoneum: No mass or adenopathy.  Peritoneum: No ascites or pneumoperitoneum.  Vascular: No acute abnormality.  OSSEOUS: No acute abnormalities.  IMPRESSION: 1. No acute findings to explain groin pain.  No urolithiasis. 2. Small fatty right inguinal hernia, stable from 2013.   Electronically Signed   By: Tiburcio Pea M.D.   On: 02/26/2014 06:01      Assessment    Right inguinal hernia - reducible     Plan    Right inguinal hernia repair with mesh.  The surgical procedure has been discussed with the patient.  Potential risks, benefits, alternative treatments, and expected outcomes have been explained.  All of the patient's questions at this time have been answered.  The likelihood of reaching the patient's treatment goal is good.  The patient understand the proposed surgical procedure and wishes to proceed.         Gerron Guidotti K. 03/18/2014, 2:46 PM

## 2014-04-02 ENCOUNTER — Other Ambulatory Visit (INDEPENDENT_AMBULATORY_CARE_PROVIDER_SITE_OTHER): Payer: Self-pay | Admitting: Surgery

## 2014-04-03 ENCOUNTER — Encounter (HOSPITAL_COMMUNITY): Payer: Self-pay | Admitting: Pharmacy Technician

## 2014-04-05 ENCOUNTER — Encounter (HOSPITAL_COMMUNITY): Payer: Self-pay | Admitting: Vascular Surgery

## 2014-04-05 ENCOUNTER — Encounter (HOSPITAL_COMMUNITY): Payer: Self-pay

## 2014-04-05 ENCOUNTER — Encounter (HOSPITAL_COMMUNITY)
Admission: RE | Admit: 2014-04-05 | Discharge: 2014-04-05 | Disposition: A | Discharge status: Home / Self Care | Payer: Managed Care, Other (non HMO) | Source: Ambulatory Visit | Attending: Surgery | Admitting: Surgery

## 2014-04-05 DIAGNOSIS — K4090 Unilateral inguinal hernia, without obstruction or gangrene, not specified as recurrent: Secondary | ICD-10-CM | POA: Diagnosis present

## 2014-04-05 DIAGNOSIS — Z01812 Encounter for preprocedural laboratory examination: Secondary | ICD-10-CM | POA: Insufficient documentation

## 2014-04-05 DIAGNOSIS — Z01810 Encounter for preprocedural cardiovascular examination: Secondary | ICD-10-CM | POA: Insufficient documentation

## 2014-04-05 HISTORY — DX: Anxiety disorder, unspecified: F41.9

## 2014-04-05 HISTORY — DX: Headache: R51

## 2014-04-05 HISTORY — DX: Cardiac arrhythmia, unspecified: I49.9

## 2014-04-05 HISTORY — DX: Unspecified hearing loss, unspecified ear: H91.90

## 2014-04-05 HISTORY — DX: Angina pectoris, unspecified: I20.9

## 2014-04-05 LAB — CBC
HEMATOCRIT: 53.5 % — AB (ref 39.0–52.0)
Hemoglobin: 18 g/dL — ABNORMAL HIGH (ref 13.0–17.0)
MCH: 32.5 pg (ref 26.0–34.0)
MCHC: 33.6 g/dL (ref 30.0–36.0)
MCV: 96.6 fL (ref 78.0–100.0)
PLATELETS: 214 10*3/uL (ref 150–400)
RBC: 5.54 MIL/uL (ref 4.22–5.81)
RDW: 14 % (ref 11.5–15.5)
WBC: 5.5 10*3/uL (ref 4.0–10.5)

## 2014-04-05 LAB — BASIC METABOLIC PANEL
Anion gap: 12 (ref 5–15)
BUN: 14 mg/dL (ref 6–23)
CALCIUM: 9.4 mg/dL (ref 8.4–10.5)
CO2: 24 meq/L (ref 19–32)
CREATININE: 1.1 mg/dL (ref 0.50–1.35)
Chloride: 101 mEq/L (ref 96–112)
GFR calc Af Amer: 86 mL/min — ABNORMAL LOW (ref >90)
GFR, EST NON AFRICAN AMERICAN: 74 mL/min — AB (ref >90)
GLUCOSE: 86 mg/dL (ref 70–99)
Potassium: 4.4 mEq/L (ref 3.7–5.3)
SODIUM: 137 meq/L (ref 137–147)

## 2014-04-05 NOTE — Progress Notes (Signed)
Anesthesia PAT Evaluation:  Patient is a 53 year old male scheduled for right IHR on 04/10/14 by Dr. Corliss Skains.  History includes former smoker, chest pain for 2-3 years, GERD, anxiety, palpitations, childhood seizures, headaches, hard of hearing, low T, recurrent left hernia s/p IHR and VHR '13. Family history of MI (father, age 4). No PCP--goes to [facility] on [address] as needed.  Meds: Testosterone injection, Percocet, omega 3.  EKG on 04/05/14 showed: SR with PACs.   Preoperative labs noted.  H/H 18.0/53.5.  I was asked to evaluate patient this afternoon during his PAT visit due to reports of chest pain history  He works at [employer] and gets home around 4 am to sleep.  For the past 2-3 years, he occasionally gets woken up after a few hours of sleep with a squeezing "muscle spasm" type of chest pain in his mid epigastric region.  It occurs every few months with no increase in frequency except it did occur the past two mornings.  Pain is 10/10, non-radiating.  It lasts for several minutes.  He will get temporary relief with deep cough or drinking cold water.  It eventually goes away.  There is no associated nausea, diaphoresis, jaw or arm pain.  He denies SOB or new change in his energy level.  He job keeps him fairly active.  He does primarily weight lifting and not cardio at the gym.  He gets occasional palpitations with stress.  Denies known history of CAD, MI, or CHF.  He started to get a twinge of chest pain when walking down the hall to PAT, but it only lasted a few seconds and went away on its on without having to stop walking.  He denies chest pain at present.  He has noticed that his right groin has been more painful.  He is passing flatus. No N/V. Exam shows a caucasian male in NAD, heart RRR, no murmur noted.  Lungs clear.  No edema noted.  Patient with history of intermittent chest pain for years with no real change except it occurred two days in a row. His brief chest pain with walking  also subsided while walking.  When he isn't able to lift weights, he thinks it causes his chest muscles to spasm.  I think he has some typical and atypical features. He has a strong family history of CAD.  I reviewed above with anesthesiologist Dr. Jean Rosenthal who agrees with need for preoperative cardiology evaluation.  In the interim, he was notified to call EMS if he develops new chest pain at rest and go to the ED if he is having recurrent exertional chest pain--since this would be new for him.  He and his significant other verbalized understanding.  I also notified him that he should notify Dr. Corliss Skains or the covering surgeon if his groin pain continued to worsen as he may need that further evaluated as well.  Triage nurse Elease Hashimoto notified on need for preoperative cardiology evaluation ~ 4:30 PM today.  Velna Ochs [facility] Short Stay Center/Anesthesiology Phone [phone] 04/05/2014 6:45 PM

## 2014-04-05 NOTE — Progress Notes (Addendum)
Pt reports that he has experience chest pain recently, last time was when he was walking in th hallway to PAT. Patient states that the patient is a squeezing pain and patient points to epigastric region.  Mr. Kwaku he has had this pain in the past, "maybe every other month" , this is the first time I have had the pain 3 days in a row."  Patient stated that on Wednesday and Thursday that he woke up with chest pain and it was a "huge pain". I stretched to see if that would help, it didn't relieve it completely."   Patient denies SOB, lightheadedness or diaphoresis while having pain.a few more hours".  Patient denies SOB, lightheadedness or diaphoresis while having pain.  Edmonia Caprio, PA informed.

## 2014-04-05 NOTE — Pre-Procedure Instructions (Signed)
Joel Lawrence  04/05/2014   Your procedure is scheduled on: Wednesday, September 16.  Report to [facility] Admitting at 7:45 AM.  Call this number if you have problems the morning of surgery: [phone]   Remember:   Do not eat food or drink liquids after midnight Tuesday, September 15.  Take these medicines the morning of surgery with A SIP OF WATER: None.               Stop taking Omega 3 Fatty Acids.   Do not wear jewelry, make-up or nail polish.  Do not wear lotions, powders, or perfumes.    Men may shave face and neck.  Do not bring valuables to the hospital.             [facility] is not responsible for any belongings or valuables.               Contacts, dentures or bridgework may not be worn into surgery.  Leave suitcase in the car. After surgery it may be brought to your room.  For patients admitted to the hospital, discharge time is determined by your   treatment team.               Patients discharged the day of surgery will not be allowed to drive home.  Name and phone number of your driver: -   Special Instructions: Review  [place] - Preparing For Surgery.   Please read over the following fact sheets that you were given: Pain Booklet, Coughing and Deep Breathing and Surgical Site Infection Prevention

## 2014-04-09 ENCOUNTER — Ambulatory Visit: Payer: 59 | Admitting: Physician Assistant

## 2014-04-10 ENCOUNTER — Telehealth (INDEPENDENT_AMBULATORY_CARE_PROVIDER_SITE_OTHER): Payer: Self-pay

## 2014-04-10 ENCOUNTER — Ambulatory Visit (HOSPITAL_COMMUNITY): Admission: RE | Admit: 2014-04-10 | Payer: Managed Care, Other (non HMO) | Source: Ambulatory Visit | Admitting: Surgery

## 2014-04-10 ENCOUNTER — Encounter (HOSPITAL_COMMUNITY): Admission: RE | Payer: Self-pay | Source: Ambulatory Visit

## 2014-04-10 SURGERY — REPAIR, HERNIA, INGUINAL, ADULT
Anesthesia: General | Laterality: Right

## 2014-04-10 NOTE — Telephone Encounter (Signed)
Pt is calling today to see if Dr Corliss Skains could give him something for pain before his sx. Pt states that at times his pain is excruciating. Advised that he can also take up to  Ibuprofen every 8 hours as needed and he can place heat/ice on the area for pain relief. Informed pt that I would send this message to Dr Corliss Skains and we will contact him as soon as we recieve a message. Pt verbalized understanding.

## 2014-04-11 ENCOUNTER — Encounter: Payer: Self-pay | Admitting: Cardiology

## 2014-04-11 ENCOUNTER — Ambulatory Visit (INDEPENDENT_AMBULATORY_CARE_PROVIDER_SITE_OTHER): Payer: Managed Care, Other (non HMO) | Admitting: Cardiology

## 2014-04-11 VITALS — BP 122/74 | HR 83 | Ht 71.0 in | Wt 220.0 lb

## 2014-04-11 DIAGNOSIS — K4091 Unilateral inguinal hernia, without obstruction or gangrene, recurrent: Secondary | ICD-10-CM

## 2014-04-11 DIAGNOSIS — Z01818 Encounter for other preprocedural examination: Secondary | ICD-10-CM | POA: Insufficient documentation

## 2014-04-11 DIAGNOSIS — K219 Gastro-esophageal reflux disease without esophagitis: Secondary | ICD-10-CM | POA: Insufficient documentation

## 2014-04-11 NOTE — Patient Instructions (Signed)
START OTC NEXIUM 40 MG DAILY  Follow up as needed

## 2014-04-11 NOTE — Progress Notes (Signed)
Joel Lawrence Date of Birth:  02/17/1961 Joel Lawrence [address] [place], [place]  [address] [phone]        Fax   [phone]   History of Present Illness: This pleasant 53 year old gentleman is seen at the request of Joel Lawrence for preoperative cardiac evaluation.  This gentleman is in need of urgent surgery for a painful right inguinal hernia.  The patient has no history of known coronary disease.  He has a family history of heart problems in his father who died of lung cancer at age 16.  The patient does not know anything about his mother's health.  The patient is a nonsmoker.  He does not have diabetes and has no history of hypercholesterolemia.  He has occasional epigastric and low substernal chest discomfort which wakes him up from sleep.  It lasted about 3 minutes.  It is not associated with any dyspnea.  He gets out of bed and drinks a glass of water and walks around and sometimes coughs and the chest discomfort disappears.  He does have a past history of heartburn after eating certain spicy foods.  The patient does not have any history of exertional chest pain.  He goes to the gym frequently to work out.  He does strenuous aerobic exercise with no chest discomfort.  Current Outpatient Prescriptions  Medication Sig Dispense Refill  . esomeprazole (NEXIUM) 40 MG capsule Take 40 mg by mouth daily at 12 noon.      Marland Kitchen oxyCODONE-acetaminophen (PERCOCET/ROXICET) 5-325 MG per tablet Take by mouth every 4 (four) hours as needed for severe pain.      . Omega-3 Fatty Acids (OMEGA 3 PO) Take 3 capsules by mouth 2 (two) times daily.      Marland Kitchen testosterone cypionate (DEPOTESTOTERONE CYPIONATE) 200 MG/ML injection Inject 100 mg into the muscle every 7 (seven) days. tuesdays       No current facility-administered medications for this visit.    Allergies  Allergen Reactions  . Other     States that some ointments and deodorant cause him to have a rash    Patient Active  Problem List   Diagnosis Date Noted  . Pre-op evaluation 04/11/2014  . GERD (gastroesophageal reflux disease) 04/11/2014  . Right inguinal hernia 03/18/2014  . Ventral hernia 09/16/2011  . Recurrent left inguinal hernia 09/16/2011    History  Smoking status  . Former Smoker  . Quit date: 07/26/1978  Smokeless tobacco  . Never Used    History  Alcohol Use No    Family History  Problem Relation Age of Onset  . Cancer Father     lung  . Heart disease Father     Review of Systems: Constitutional: no fever chills diaphoresis or fatigue or change in weight.  Head and neck: no hearing loss, no epistaxis, no photophobia or visual disturbance. Respiratory: No cough, shortness of breath or wheezing. Cardiovascular: No chest pain peripheral edema, palpitations. Gastrointestinal: No abdominal distention, no abdominal pain, no change in bowel habits hematochezia or melena. Genitourinary: No dysuria, no frequency, no urgency, no nocturia. Musculoskeletal:No arthralgias, no back pain, no gait disturbance or myalgias. Neurological: No dizziness, no headaches, no numbness, no seizures, no syncope, no weakness, no tremors. Hematologic: No lymphadenopathy, no easy bruising. Psychiatric: No confusion, no hallucinations, no sleep disturbance.    Physical Exam: Filed Vitals:   04/11/14 1128  BP: 122/74  Pulse: 83  The patient appears to be in no distress.  Head and  neck exam reveals that the pupils are equal and reactive.  The extraocular movements are full.  There is no scleral icterus.  Mouth and pharynx are benign.  No lymphadenopathy.  No carotid bruits.  The jugular venous pressure is normal.  Thyroid is not enlarged or tender.  Chest is clear to percussion and auscultation.  No rales or rhonchi.  Expansion of the chest is symmetrical.  Heart reveals no abnormal lift or heave.  First and second heart sounds are normal.  There is no murmur gallop rub or click.  The abdomen is  soft and nontender.  Bowel sounds are normoactive.  There is no hepatosplenomegaly or mass.  There are no abdominal bruits.  Extremities reveal no phlebitis or edema.  Pedal pulses are good.  There is no cyanosis or clubbing.  Neurologic exam is normal strength and no lateralizing weakness.  No sensory deficits.  Integument reveals no rash  EKG today shows normal sinus rhythm with sinus arrhythmia and no ischemic changes.  The tracing is unchanged since 04/05/14 to  Assessment / Plan: 1. noncardiac chest pain probably secondary to GERD awakening him from sleep. 2. right inguinal hernia  Disposition: The patient is cleared for inguinal hernia surgery and general anesthesia.  No further cardiac workup is indicated. Many thanks for the opportunity to see this pleasant gentleman with you.

## 2014-04-18 MED ORDER — CEFAZOLIN SODIUM-DEXTROSE 2-3 GM-% IV SOLR
2.0000 g | INTRAVENOUS | Status: AC
  Administered 2014-04-19: 2 g via INTRAVENOUS
  Filled 2014-04-18: qty 50

## 2014-04-18 NOTE — Progress Notes (Signed)
Pt made aware to arrive at 5:30 AM tomorrow, Friday, Sept. 25, 2015

## 2014-04-19 ENCOUNTER — Encounter (HOSPITAL_COMMUNITY): Payer: Self-pay | Admitting: Anesthesiology

## 2014-04-19 ENCOUNTER — Encounter (HOSPITAL_COMMUNITY)
Admission: RE | Disposition: A | Discharge status: Home / Self Care | Payer: Self-pay | Source: Ambulatory Visit | Attending: Surgery

## 2014-04-19 ENCOUNTER — Encounter (HOSPITAL_COMMUNITY): Payer: Managed Care, Other (non HMO) | Admitting: Anesthesiology

## 2014-04-19 ENCOUNTER — Ambulatory Visit (HOSPITAL_COMMUNITY): Payer: Managed Care, Other (non HMO) | Admitting: Anesthesiology

## 2014-04-19 ENCOUNTER — Ambulatory Visit (HOSPITAL_COMMUNITY)
Admission: RE | Admit: 2014-04-19 | Discharge: 2014-04-19 | Disposition: A | Discharge status: Home / Self Care | Payer: Managed Care, Other (non HMO) | Source: Ambulatory Visit | Attending: Surgery | Admitting: Surgery

## 2014-04-19 DIAGNOSIS — K4090 Unilateral inguinal hernia, without obstruction or gangrene, not specified as recurrent: Secondary | ICD-10-CM | POA: Insufficient documentation

## 2014-04-19 DIAGNOSIS — Z87891 Personal history of nicotine dependence: Secondary | ICD-10-CM | POA: Insufficient documentation

## 2014-04-19 DIAGNOSIS — R569 Unspecified convulsions: Secondary | ICD-10-CM | POA: Diagnosis not present

## 2014-04-19 DIAGNOSIS — K219 Gastro-esophageal reflux disease without esophagitis: Secondary | ICD-10-CM | POA: Diagnosis not present

## 2014-04-19 HISTORY — PX: INSERTION OF MESH: SHX5868

## 2014-04-19 HISTORY — PX: INGUINAL HERNIA REPAIR: SHX194

## 2014-04-19 SURGERY — REPAIR, HERNIA, INGUINAL, ADULT
Anesthesia: General | Site: Groin | Laterality: Right

## 2014-04-19 MED ORDER — OXYCODONE-ACETAMINOPHEN 5-325 MG PO TABS: 1.0000 | ORAL_TABLET | ORAL | Status: DC | PRN

## 2014-04-19 MED ORDER — DEXAMETHASONE SODIUM PHOSPHATE 4 MG/ML IJ SOLN
INTRAMUSCULAR | Status: DC | PRN
  Administered 2014-04-19: 8 mg via INTRAVENOUS

## 2014-04-19 MED ORDER — GLYCOPYRROLATE 0.2 MG/ML IJ SOLN
INTRAMUSCULAR | Status: AC
  Filled 2014-04-19: qty 3

## 2014-04-19 MED ORDER — ONDANSETRON HCL 4 MG/2ML IJ SOLN
INTRAMUSCULAR | Status: DC | PRN
  Administered 2014-04-19: 4 mg via INTRAVENOUS

## 2014-04-19 MED ORDER — HYDROMORPHONE HCL 1 MG/ML IJ SOLN
0.2500 mg | INTRAMUSCULAR | Status: DC | PRN
  Administered 2014-04-19: 0.5 mg via INTRAVENOUS
  Administered 2014-04-19: 0.25 mg via INTRAVENOUS
  Administered 2014-04-19 (×2): 0.5 mg via INTRAVENOUS

## 2014-04-19 MED ORDER — SCOPOLAMINE 1 MG/3DAYS TD PT72
MEDICATED_PATCH | TRANSDERMAL | Status: AC
  Administered 2014-04-19: 1 via TRANSDERMAL
  Filled 2014-04-19: qty 1

## 2014-04-19 MED ORDER — LIDOCAINE HCL (CARDIAC) 20 MG/ML IV SOLN
INTRAVENOUS | Status: AC
  Filled 2014-04-19: qty 5

## 2014-04-19 MED ORDER — ROCURONIUM BROMIDE 100 MG/10ML IV SOLN
INTRAVENOUS | Status: DC | PRN
  Administered 2014-04-19: 30 mg via INTRAVENOUS

## 2014-04-19 MED ORDER — GLYCOPYRROLATE 0.2 MG/ML IJ SOLN
INTRAMUSCULAR | Status: DC | PRN
  Administered 2014-04-19: 0.6 mg via INTRAVENOUS

## 2014-04-19 MED ORDER — EPHEDRINE SULFATE 50 MG/ML IJ SOLN
INTRAMUSCULAR | Status: AC
  Filled 2014-04-19: qty 1

## 2014-04-19 MED ORDER — FENTANYL CITRATE 0.05 MG/ML IJ SOLN
INTRAMUSCULAR | Status: DC | PRN
  Administered 2014-04-19 (×2): 50 ug via INTRAVENOUS

## 2014-04-19 MED ORDER — DEXAMETHASONE SODIUM PHOSPHATE 4 MG/ML IJ SOLN
INTRAMUSCULAR | Status: AC
  Filled 2014-04-19: qty 2

## 2014-04-19 MED ORDER — PHENYLEPHRINE HCL 10 MG/ML IJ SOLN
INTRAMUSCULAR | Status: DC | PRN
  Administered 2014-04-19: 80 ug via INTRAVENOUS

## 2014-04-19 MED ORDER — PHENYLEPHRINE HCL 10 MG/ML IJ SOLN
INTRAMUSCULAR | Status: AC
  Filled 2014-04-19: qty 1

## 2014-04-19 MED ORDER — BUPIVACAINE-EPINEPHRINE (PF) 0.25% -1:200000 IJ SOLN
INTRAMUSCULAR | Status: AC
  Filled 2014-04-19: qty 30

## 2014-04-19 MED ORDER — OXYCODONE-ACETAMINOPHEN 5-325 MG PO TABS
1.0000 | ORAL_TABLET | ORAL | Status: DC | PRN
Start: 2014-04-19 — End: 2015-09-02

## 2014-04-19 MED ORDER — CHLORHEXIDINE GLUCONATE 4 % EX LIQD
1.0000 "application " | Freq: Once | CUTANEOUS | Status: DC
  Filled 2014-04-19: qty 15

## 2014-04-19 MED ORDER — ONDANSETRON HCL 4 MG/2ML IJ SOLN: 4.0000 mg | INTRAMUSCULAR | Status: DC | PRN

## 2014-04-19 MED ORDER — STERILE WATER FOR INJECTION IJ SOLN
INTRAMUSCULAR | Status: AC
  Filled 2014-04-19: qty 10

## 2014-04-19 MED ORDER — ONDANSETRON HCL 4 MG/2ML IJ SOLN: 4.0000 mg | Freq: Once | INTRAMUSCULAR | Status: DC | PRN

## 2014-04-19 MED ORDER — NEOSTIGMINE METHYLSULFATE 10 MG/10ML IV SOLN
INTRAVENOUS | Status: AC
  Filled 2014-04-19: qty 1

## 2014-04-19 MED ORDER — ROCURONIUM BROMIDE 50 MG/5ML IV SOLN
INTRAVENOUS | Status: AC
  Filled 2014-04-19: qty 1

## 2014-04-19 MED ORDER — HYDROMORPHONE HCL 1 MG/ML IJ SOLN: 1.0000 mg | INTRAMUSCULAR | Status: DC | PRN

## 2014-04-19 MED ORDER — HYDROMORPHONE HCL 1 MG/ML IJ SOLN
INTRAMUSCULAR | Status: AC
  Filled 2014-04-19: qty 1

## 2014-04-19 MED ORDER — BUPIVACAINE-EPINEPHRINE 0.25% -1:200000 IJ SOLN
INTRAMUSCULAR | Status: DC | PRN
  Administered 2014-04-19: 10 mL

## 2014-04-19 MED ORDER — PROPOFOL 10 MG/ML IV BOLUS
INTRAVENOUS | Status: AC
Start: 2014-04-19 — End: 2014-04-19
  Filled 2014-04-19: qty 20

## 2014-04-19 MED ORDER — NEOSTIGMINE METHYLSULFATE 10 MG/10ML IV SOLN
INTRAVENOUS | Status: DC | PRN
  Administered 2014-04-19: 4 mg via INTRAVENOUS

## 2014-04-19 MED ORDER — PROPOFOL 10 MG/ML IV BOLUS
INTRAVENOUS | Status: AC
  Filled 2014-04-19: qty 20

## 2014-04-19 MED ORDER — LIDOCAINE HCL (CARDIAC) 20 MG/ML IV SOLN
INTRAVENOUS | Status: DC | PRN
  Administered 2014-04-19: 50 mg via INTRAVENOUS

## 2014-04-19 MED ORDER — PROPOFOL 10 MG/ML IV BOLUS
INTRAVENOUS | Status: DC | PRN
  Administered 2014-04-19: 200 mg via INTRAVENOUS

## 2014-04-19 MED ORDER — ARTIFICIAL TEARS OP OINT
TOPICAL_OINTMENT | OPHTHALMIC | Status: AC
  Filled 2014-04-19: qty 3.5

## 2014-04-19 MED ORDER — SUCCINYLCHOLINE CHLORIDE 20 MG/ML IJ SOLN
INTRAMUSCULAR | Status: AC
  Filled 2014-04-19: qty 1

## 2014-04-19 MED ORDER — ONDANSETRON HCL 4 MG/2ML IJ SOLN
INTRAMUSCULAR | Status: AC
  Filled 2014-04-19: qty 2

## 2014-04-19 MED ORDER — LACTATED RINGERS IV SOLN
INTRAVENOUS | Status: DC | PRN
  Administered 2014-04-19 (×2): via INTRAVENOUS

## 2014-04-19 MED ORDER — MIDAZOLAM HCL 5 MG/5ML IJ SOLN
INTRAMUSCULAR | Status: DC | PRN
  Administered 2014-04-19: 2 mg via INTRAVENOUS

## 2014-04-19 MED ORDER — FENTANYL CITRATE 0.05 MG/ML IJ SOLN
INTRAMUSCULAR | Status: AC
  Filled 2014-04-19: qty 5

## 2014-04-19 MED ORDER — 0.9 % SODIUM CHLORIDE (POUR BTL) OPTIME
TOPICAL | Status: DC | PRN
  Administered 2014-04-19: 1000 mL

## 2014-04-19 MED ORDER — MIDAZOLAM HCL 2 MG/2ML IJ SOLN
INTRAMUSCULAR | Status: AC
  Filled 2014-04-19: qty 2

## 2014-04-19 SURGICAL SUPPLY — 49 items
BENZOIN TINCTURE PRP APPL 2/3 (GAUZE/BANDAGES/DRESSINGS) ×3 IMPLANT
BLADE SURG 10 STRL SS (BLADE) ×3 IMPLANT
BLADE SURG 15 STRL LF DISP TIS (BLADE) ×1 IMPLANT
BLADE SURG 15 STRL SS (BLADE) ×2
BLADE SURG ROTATE 9660 (MISCELLANEOUS) ×3 IMPLANT
CHLORAPREP W/TINT 26ML (MISCELLANEOUS) ×3 IMPLANT
CLOSURE WOUND 1/2 X4 (GAUZE/BANDAGES/DRESSINGS) ×1
COVER SURGICAL LIGHT HANDLE (MISCELLANEOUS) ×3 IMPLANT
DECANTER SPIKE VIAL GLASS SM (MISCELLANEOUS) IMPLANT
DRAIN PENROSE 1/2X12 LTX STRL (WOUND CARE) ×3 IMPLANT
DRAPE LAPAROSCOPIC ABDOMINAL (DRAPES) IMPLANT
DRAPE LAPAROTOMY TRNSV 102X78 (DRAPE) IMPLANT
DRAPE UTILITY 15X26 W/TAPE STR (DRAPE) ×6 IMPLANT
DRSG TEGADERM 4X4.75 (GAUZE/BANDAGES/DRESSINGS) ×3 IMPLANT
ELECT CAUTERY BLADE 6.4 (BLADE) ×3 IMPLANT
ELECT REM PT RETURN 9FT ADLT (ELECTROSURGICAL) ×3
ELECTRODE REM PT RTRN 9FT ADLT (ELECTROSURGICAL) ×1 IMPLANT
GAUZE SPONGE 4X4 12PLY STRL (GAUZE/BANDAGES/DRESSINGS) ×3 IMPLANT
GAUZE SPONGE 4X4 16PLY XRAY LF (GAUZE/BANDAGES/DRESSINGS) IMPLANT
GLOVE BIO SURGEON STRL SZ7 (GLOVE) ×3 IMPLANT
GLOVE BIO SURGEON STRL SZ7.5 (GLOVE) ×3 IMPLANT
GLOVE BIOGEL PI IND STRL 7.5 (GLOVE) ×2 IMPLANT
GLOVE BIOGEL PI INDICATOR 7.5 (GLOVE) ×4
GOWN STRL REUS W/ TWL LRG LVL3 (GOWN DISPOSABLE) ×2 IMPLANT
GOWN STRL REUS W/TWL LRG LVL3 (GOWN DISPOSABLE) ×4
KIT BASIN OR (CUSTOM PROCEDURE TRAY) ×3 IMPLANT
KIT ROOM TURNOVER OR (KITS) ×3 IMPLANT
MESH PARIETEX PROGRIP RIGHT (Mesh General) ×3 IMPLANT
NEEDLE HYPO 25GX1X1/2 BEV (NEEDLE) ×3 IMPLANT
NS IRRIG 1000ML POUR BTL (IV SOLUTION) ×3 IMPLANT
PACK SURGICAL SETUP 50X90 (CUSTOM PROCEDURE TRAY) ×3 IMPLANT
PAD ARMBOARD 7.5X6 YLW CONV (MISCELLANEOUS) ×3 IMPLANT
PENCIL BUTTON HOLSTER BLD 10FT (ELECTRODE) ×3 IMPLANT
SPECIMEN JAR SMALL (MISCELLANEOUS) IMPLANT
SPONGE INTESTINAL PEANUT (DISPOSABLE) ×3 IMPLANT
STRIP CLOSURE SKIN 1/2X4 (GAUZE/BANDAGES/DRESSINGS) ×2 IMPLANT
SUT MNCRL AB 4-0 PS2 18 (SUTURE) ×3 IMPLANT
SUT PDS AB 0 CT 36 (SUTURE) IMPLANT
SUT SILK 2 0 SH (SUTURE) IMPLANT
SUT SILK 3 0 (SUTURE)
SUT SILK 3-0 18XBRD TIE 12 (SUTURE) IMPLANT
SUT VIC AB 0 CT2 27 (SUTURE) ×6 IMPLANT
SUT VIC AB 2-0 SH 27 (SUTURE) ×2
SUT VIC AB 2-0 SH 27X BRD (SUTURE) ×1 IMPLANT
SUT VIC AB 3-0 SH 27 (SUTURE) ×2
SUT VIC AB 3-0 SH 27XBRD (SUTURE) ×1 IMPLANT
SYR CONTROL 10ML LL (SYRINGE) ×3 IMPLANT
TOWEL OR 17X24 6PK STRL BLUE (TOWEL DISPOSABLE) ×3 IMPLANT
TOWEL OR 17X26 10 PK STRL BLUE (TOWEL DISPOSABLE) ×3 IMPLANT

## 2014-04-19 NOTE — Discharge Instructions (Signed)
[facility] ° ° INGUINAL HERNIA REPAIR: POST OP INSTRUCTIONS ° °Always review your discharge instruction sheet given to you by the facility where your Lawrence was performed. °IF YOU HAVE DISABILITY OR FAMILY LEAVE FORMS, YOU MUST BRING THEM TO THE OFFICE FOR PROCESSING.   °DO NOT GIVE THEM TO YOUR DOCTOR. ° °1. A  prescription for pain medication may be given to you upon discharge.  Take your pain medication as prescribed, if needed.  If narcotic pain medicine is not needed, then you may take acetaminophen (Tylenol) or ibuprofen (Advil) as needed. °2. Take your usually prescribed medications unless otherwise directed. °3. If you need a refill on your pain medication, please contact your pharmacy.  They will contact our office to request authorization. Prescriptions will not be filled after 5 pm or on week-ends. °4. You should follow a light diet the first 24 hours after arrival home, such as soup and crackers, etc.  Be sure to include lots of fluids daily.  Resume your normal diet the day after Lawrence. °5. Most patients will experience some swelling and bruising around the umbilicus or in the groin and scrotum.  Ice packs and reclining will help.  Swelling and bruising can take several days to resolve.  °6. It is common to experience some constipation if taking pain medication after Lawrence.  Increasing fluid intake and taking a stool softener (such as Colace) will usually help or prevent this problem from occurring.  A mild laxative (Milk of Magnesia or Miralax) should be taken according to package directions if there are no bowel movements after 48 hours. °7. Unless discharge instructions indicate otherwise, you may remove your bandages 24-48 hours after Lawrence, and you may shower at that time.  You will have steri-strips (small skin tapes) in place directly over the incision.  These strips should be left on the skin for 7-10 days. °8. ACTIVITIES:  You may resume regular (light) daily activities  beginning the next day--such as daily self-care, walking, climbing stairs--gradually increasing activities as tolerated.  You may have sexual intercourse when it is comfortable.  Refrain from any heavy lifting or straining until approved by your doctor. °a. You may drive when you are no longer taking prescription pain medication, you can comfortably wear a seatbelt, and you can safely maneuver your car and apply brakes. °b. RETURN TO WORK:  2-3 weeks with light duty - no lifting over 15 lbs. °9. You should see your doctor in the office for a follow-up appointment approximately 2-3 weeks after your Lawrence.  Make sure that you call for this appointment within a day or two after you arrive home to insure a convenient appointment time. °10. OTHER INSTRUCTIONS:  __________________________________________________________________________________________________________________________________________________________________________________________  °WHEN TO CALL YOUR DOCTOR: °1. Fever over 101.0 °2. Inability to urinate °3. Nausea and/or vomiting °4. Extreme swelling or bruising °5. Continued bleeding from incision. °6. Increased pain, redness, or drainage from the incision ° °The clinic staff is available to answer your questions during regular business hours.  Please don’t hesitate to call and ask to speak to one of the nurses for clinical concerns.  If you have a medical emergency, go to the nearest emergency room or call 911.  A surgeon from [facility]  [facility] is always on call at the hospital ° ° °[address], [address], [place], [place]  [address] ? ° [address] [address], [place],    [address] °[phone]    [phone]    FAX [phone] °Web site:   [email or web address] ° °What to eat: ° °For your first meals, you should eat lightly; only small meals initially.  If you do not have nausea, you may eat larger meals.  Avoid spicy, greasy and heavy food.   ° °General Anesthesia, Adult, Care  After  °Refer to this sheet in the next few weeks. These instructions provide you with information on caring for yourself after your procedure. Your health care provider may also give you more specific instructions. Your treatment has been planned according to current medical practices, but problems sometimes occur. Call your health care provider if you have any problems or questions after your procedure.  °WHAT TO EXPECT AFTER THE PROCEDURE  °After the procedure, it is typical to experience:  °Sleepiness.  °Nausea and vomiting. °HOME CARE INSTRUCTIONS  °For the first 24 hours after general anesthesia:  °Have a responsible person with you.  °Do not drive a car. If you are alone, do not take public transportation.  °Do not drink alcohol.  °Do not take medicine that has not been prescribed by your health care provider.  °Do not sign important papers or make important decisions.  °You may resume a normal diet and activities as directed by your health care provider.  °Change bandages (dressings) as directed.  °If you have questions or problems that seem related to general anesthesia, call the hospital and ask for the anesthetist or anesthesiologist on call. °SEEK MEDICAL CARE IF:  °You have nausea and vomiting that continue the day after anesthesia.  °You develop a rash. °SEEK IMMEDIATE MEDICAL CARE IF:  °You have difficulty breathing.  °You have chest pain.  °You have any allergic problems. °Document Released: 10/18/2000 Document Revised: 03/14/2013 Document Reviewed: 01/25/2013  °Exit[employer]® Patient Information ©2014 [employer].  ° ° °

## 2014-04-19 NOTE — Anesthesia Postprocedure Evaluation (Signed)
  Anesthesia Post-op Note  Patient: Joel Lawrence  Procedure(s) Performed: Procedure(s): RIGHT INGUINAL HERNIA REPAIR WITH MESH (Right) INSERTION OF MESH (Right)  Patient Location: PACU  Anesthesia Type:General  Level of Consciousness: awake, alert , oriented and patient cooperative  Airway and Oxygen Therapy: Patient Spontanous Breathing  Post-op Pain: mild  Post-op Assessment: Post-op Vital signs reviewed, Patient's Cardiovascular Status Stable, Respiratory Function Stable, Patent Airway and No signs of Nausea or vomiting  Post-op Vital Signs: stable  Last Vitals:  Filed Vitals:   04/19/14 0900  BP:   Pulse: 62  Temp:   Resp: 14    Complications: No apparent anesthesia complications

## 2014-04-19 NOTE — Anesthesia Procedure Notes (Signed)
Procedure Name: Intubation Date/Time: 04/19/2014 7:59 AM Performed by: Brien Mates D Pre-anesthesia Checklist: Patient identified, Emergency Drugs available, Suction available, Patient being monitored and Timeout performed Patient Re-evaluated:Patient Re-evaluated prior to inductionOxygen Delivery Method: Circle system utilized Preoxygenation: Pre-oxygenation with 100% oxygen Intubation Type: IV induction Ventilation: Mask ventilation without difficulty Laryngoscope Size: Miller and 2 Grade View: Grade I Tube type: Oral Tube size: 7.5 mm Number of attempts: 1 Airway Equipment and Method: Stylet Placement Confirmation: ETT inserted through vocal cords under direct vision,  positive ETCO2 and breath sounds checked- equal and bilateral Secured at: 22 cm Tube secured with: Tape Dental Injury: Teeth and Oropharynx as per pre-operative assessment

## 2014-04-19 NOTE — Op Note (Signed)
Hernia, Open, Procedure Note  Indications: The patient presented with a history of a right, reducible inguinal hernia.    Pre-operative Diagnosis: right reducible inguinal hernia Post-operative Diagnosis: same  Surgeon: Wynona Luna.   Assistants: none  Anesthesia: General endotracheal anesthesia  ASA Class: 2  Procedure Details  The patient was seen again in the Holding Room. The risks, benefits, complications, treatment options, and expected outcomes were discussed with the patient. The possibilities of reaction to medication, pulmonary aspiration, perforation of viscus, bleeding, recurrent infection, the need for additional procedures, and development of a complication requiring transfusion or further operation were discussed with the patient and/or family. The likelihood of success in repairing the hernia and returning the patient to their previous functional status is good.  There was concurrence with the proposed plan, and informed consent was obtained. The site of surgery was properly noted/marked. The patient was taken to the Operating Room, identified as Joel Lawrence, and the procedure verified as right inguinal hernia repair. A Time Out was held and the above information confirmed.  The patient was placed in the supine position and underwent induction of anesthesia. The lower abdomen and groin was prepped with Chloraprep and draped in the standard fashion, and 0.25% Marcaine with epinephrine was used to anesthetize the skin over the mid-portion of the inguinal canal. An oblique incision was made. Dissection was carried down through the subcutaneous tissue with cautery to the external oblique fascia.  We opened the external oblique fascia along the direction of its fibers to the external ring.  The spermatic cord was circumferentially dissected bluntly and retracted with a Penrose drain.  The ilioinguinal nerve was identified and preserved.  The floor of the inguinal canal was  inspected and had a moderate-sized direct defect.  We skeletonized the spermatic cord and reduced a medium-sized indirect hernia sac.  The internal ring was tightened with 0 Vicryl.  The floor of the inguinal canal was closed with 0 Vicryl  We used a right-sided Progrip mesh which was inserted and deployed across the floor of the inguinal canal. The mesh was tucked underneath the external oblique fascia laterally.  The flap of the mesh was closed around the spermatic cord to recreate the internal inguinal ring.  The mesh was secured to the pubic tubercle with 0 Vicryl.  The external oblique fascia was reapproximated with 2-0 Vicryl.  3-0 Vicryl was used to close the subcutaneous tissues and 4-0 Monocryl was used to close the skin in subcuticular fashion.  Benzoin and steri-strips were used to seal the incision.  A clean dressing was applied.  The patient was then extubated and brought to the recovery room in stable condition.  All sponge, instrument, and needle counts were correct prior to closure and at the conclusion of the case.   Estimated Blood Loss: Minimal                 Complications: None; patient tolerated the procedure well.         Disposition: PACU - hemodynamically stable.         Condition: stable  Wilmon Arms. Corliss Skains, MD, [facility] Surgery  General/ Trauma Surgery  04/19/2014 8:25 AM

## 2014-04-19 NOTE — Transfer of Care (Signed)
Immediate Anesthesia Transfer of Care Note  Patient: Joel Lawrence  Procedure(s) Performed: Procedure(s): RIGHT INGUINAL HERNIA REPAIR WITH MESH (Right) INSERTION OF MESH (Right)  Patient Location: PACU  Anesthesia Type:General  Level of Consciousness: awake and alert   Airway & Oxygen Therapy: Patient Spontanous Breathing and Patient connected to face mask oxygen  Post-op Assessment: Report given to PACU RN and Post -op Vital signs reviewed and stable  Post vital signs: Reviewed and stable  Complications: No apparent anesthesia complications

## 2014-04-19 NOTE — H&P (Signed)
HPI  Joel Lawrence is a 53 y.o. male. Former patient - new problem  HPI  This is a 53 year old male in good health who has had multiple previous hernia repairs. He underwent left inguinal hernia repair 2009 and had a recurrence in 2011. Subsequently, he developed a ventral hernia, as well as another recurrent left inguinal hernia, repaired laparoscopically in 2013. The patient works out vigorously with very large amounts of weight. He has developed pain and swelling in his right groin with intermittent radiation across to his left groin. He was evaluated in the emergency department and was found to have a right inguinal hernia. He presents now to discuss excision.  Past Medical History   Diagnosis  Date   .  Chest pain    .  PONV (postoperative nausea and vomiting)    .  GERD (gastroesophageal reflux disease)    .  Seizures      hx of as a child no problems now    Past Surgical History   Procedure  Laterality  Date   .  Hernia repair   09/01/2007     Left Inguinal hernia   .  Hernia repair   07/06/2010     recurrent left inguinal hernia   .  Ventral hernia repair   10/20/2011     Procedure: LAPAROSCOPIC VENTRAL HERNIA; Surgeon: Wilmon Arms. Corliss Skains, MD; Location: [facility]; Service: General; Laterality: N/A;   .  Inguinal hernia repair   10/20/2011     Procedure: LAPAROSCOPIC INGUINAL HERNIA; Surgeon: Wilmon Arms. Corliss Skains, MD; Location: [facility]; Service: General; Laterality: Left; recurrent    Family History   Problem  Relation  Age of Onset   .  Cancer  Father      lung   .  Heart disease  Father    Social History  History   Substance Use Topics   .  Smoking status:  Former Smoker     Quit date:  07/26/1978   .  Smokeless tobacco:  Never Used   .  Alcohol Use:  No    Allergies   Allergen  Reactions   .  Other      States that some ointments and deodorant cause him to have a rash    Current Outpatient Prescriptions   Medication  Sig  Dispense  Refill   .  Omega-3 Fatty Acids (OMEGA 3  PO)  Take 3 capsules by mouth 2 (two) times daily.     Marland Kitchen  testosterone cypionate (DEPOTESTOTERONE CYPIONATE) 200 MG/ML injection  Inject 100 mg into the muscle every 7 (seven) days.      No current facility-administered medications for this visit.   Review of Systems  Review of Systems  Constitutional: Negative for fever, chills and unexpected weight change.  HENT: Negative for congestion, hearing loss, sore throat, trouble swallowing and voice change.  Eyes: Negative for visual disturbance.  Respiratory: Negative for cough and wheezing.  Cardiovascular: Negative for chest pain, palpitations and leg swelling.  Gastrointestinal: Negative for nausea, vomiting, abdominal pain, diarrhea, constipation, blood in stool, abdominal distention, anal bleeding and rectal pain.  Genitourinary: Positive for testicular pain. Negative for hematuria and difficulty urinating.  Musculoskeletal: Negative for arthralgias.  Skin: Negative for rash and wound.  Neurological: Negative for seizures, syncope, weakness and headaches.  Hematological: Negative for adenopathy. Does not bruise/bleed easily.  Psychiatric/Behavioral: Negative for confusion.  Blood pressure 154/70, pulse 95, temperature 98 F (36.7 C), temperature source Oral, height 5' 11.5" (1.816 m),  weight 220 lb 8 oz (100.018 kg).  Physical Exam  Physical Exam  WDWN in NAD  HEENT: EOMI, sclera anicteric  Neck: No masses, no thyromegaly  Lungs: CTA bilaterally; normal respiratory effort  CV: Regular rate and rhythm; no murmurs  Abd: +bowel sounds, soft, non-tender, no masses; no ventral hernia  GU: No sign of left inguinal hernia; reducible right inguinal hernia  Ext: Well-perfused; no edema  Skin: Warm, dry; no sign of jaundice  Data Reviewed  Ct Abdomen Pelvis Wo Contrast  02/26/2014 CLINICAL DATA: Groin pain. Kidney stone versus hernia. EXAM: CT ABDOMEN AND PELVIS WITHOUT CONTRAST TECHNIQUE: Multidetector CT imaging of the abdomen and pelvis was  performed following the standard protocol without IV contrast. COMPARISON: 09/04/2011 FINDINGS: BODY WALL: Status post left inguinal and umbilical hernia repairs. No definitive recurrence (fat in the left inguinal region may have been entrapped at time of surgery). There is a small fatty right inguinal hernia which is stable from previous, best seen in the sagittal projection. LOWER CHEST: Unremarkable. ABDOMEN/PELVIS: Liver: No focal abnormality. Biliary: No evidence of biliary obstruction or stone. Pancreas: Unremarkable. Spleen: Unremarkable. Adrenals: Unremarkable. Kidneys and ureters: No hydronephrosis or stone. Bladder: Unremarkable. Reproductive: Unremarkable. Bowel: No obstruction. Retroperitoneum: No mass or adenopathy. Peritoneum: No ascites or pneumoperitoneum. Vascular: No acute abnormality. OSSEOUS: No acute abnormalities. IMPRESSION: 1. No acute findings to explain groin pain. No urolithiasis. 2. Small fatty right inguinal hernia, stable from 2013. Electronically Signed By: Tiburcio Pea M.D. On: 02/26/2014 06:01  Assessment  Right inguinal hernia - reducible  Plan  Right inguinal hernia repair with mesh. The surgical procedure has been discussed with the patient. Potential risks, benefits, alternative treatments, and expected outcomes have been explained. All of the patient's questions at this time have been answered. The likelihood of reaching the patient's treatment goal is good. The patient understand the proposed surgical procedure and wishes to proceed.    Wilmon Arms. Corliss Skains, MD, [facility] Surgery  General/ Trauma Surgery  04/19/2014 7:07 AM

## 2014-04-19 NOTE — Anesthesia Preprocedure Evaluation (Addendum)
Anesthesia Evaluation  Patient identified by MRN, date of birth, ID band Patient awake    Reviewed: Allergy & Precautions, H&P , NPO status , Patient's Chart, lab work & pertinent test results  History of Anesthesia Complications (+) PONV  Airway Mallampati: II TM Distance: >3 FB Neck ROM: Full    Dental  (+) Teeth Intact, Dental Advisory Given   Pulmonary former smoker,          Cardiovascular + angina + dysrhythmias     Neuro/Psych  Headaches, Seizures -,     GI/Hepatic GERD-  ,  Endo/Other    Renal/GU      Musculoskeletal   Abdominal   Peds  Hematology   Anesthesia Other Findings   Reproductive/Obstetrics                          Anesthesia Physical Anesthesia Plan  ASA: II  Anesthesia Plan: General   Post-op Pain Management:    Induction: Intravenous  Airway Management Planned: Oral ETT and LMA  Additional Equipment:   Intra-op Plan:   Post-operative Plan: Extubation in OR  Informed Consent: I have reviewed the patients History and Physical, chart, labs and discussed the procedure including the risks, benefits and alternatives for the proposed anesthesia with the patient or authorized representative who has indicated his/her understanding and acceptance.   Dental advisory given  Plan Discussed with: CRNA, Anesthesiologist and Surgeon  Anesthesia Plan Comments:        Anesthesia Quick Evaluation

## 2014-04-23 ENCOUNTER — Encounter (HOSPITAL_COMMUNITY): Payer: Self-pay | Admitting: Surgery

## 2015-09-02 ENCOUNTER — Encounter (HOSPITAL_COMMUNITY): Payer: Self-pay | Admitting: Emergency Medicine

## 2015-09-02 ENCOUNTER — Emergency Department (HOSPITAL_COMMUNITY): Payer: Managed Care, Other (non HMO)

## 2015-09-02 ENCOUNTER — Emergency Department (HOSPITAL_COMMUNITY)
Admission: EM | Admit: 2015-09-02 | Discharge: 2015-09-02 | Disposition: A | Discharge status: Home / Self Care | Payer: Managed Care, Other (non HMO) | Attending: Emergency Medicine | Admitting: Emergency Medicine

## 2015-09-02 DIAGNOSIS — I209 Angina pectoris, unspecified: Secondary | ICD-10-CM | POA: Insufficient documentation

## 2015-09-02 DIAGNOSIS — F419 Anxiety disorder, unspecified: Secondary | ICD-10-CM | POA: Insufficient documentation

## 2015-09-02 DIAGNOSIS — K219 Gastro-esophageal reflux disease without esophagitis: Secondary | ICD-10-CM | POA: Insufficient documentation

## 2015-09-02 DIAGNOSIS — M25512 Pain in left shoulder: Secondary | ICD-10-CM | POA: Insufficient documentation

## 2015-09-02 DIAGNOSIS — R079 Chest pain, unspecified: Secondary | ICD-10-CM | POA: Insufficient documentation

## 2015-09-02 DIAGNOSIS — M542 Cervicalgia: Secondary | ICD-10-CM | POA: Insufficient documentation

## 2015-09-02 DIAGNOSIS — Z87891 Personal history of nicotine dependence: Secondary | ICD-10-CM | POA: Insufficient documentation

## 2015-09-02 DIAGNOSIS — Z79899 Other long term (current) drug therapy: Secondary | ICD-10-CM | POA: Insufficient documentation

## 2015-09-02 LAB — CBC
HEMATOCRIT: 49.9 % (ref 39.0–52.0)
Hemoglobin: 16.7 g/dL (ref 13.0–17.0)
MCH: 31.2 pg (ref 26.0–34.0)
MCHC: 33.5 g/dL (ref 30.0–36.0)
MCV: 93.3 fL (ref 78.0–100.0)
Platelets: 312 10*3/uL (ref 150–400)
RBC: 5.35 MIL/uL (ref 4.22–5.81)
RDW: 15 % (ref 11.5–15.5)
WBC: 8.6 10*3/uL (ref 4.0–10.5)

## 2015-09-02 LAB — BASIC METABOLIC PANEL
Anion gap: 12 (ref 5–15)
BUN: 14 mg/dL (ref 6–20)
CO2: 22 mmol/L (ref 22–32)
Calcium: 9.2 mg/dL (ref 8.9–10.3)
Chloride: 102 mmol/L (ref 101–111)
Creatinine, Ser: 1.36 mg/dL — ABNORMAL HIGH (ref 0.61–1.24)
GFR calc Af Amer: >60 mL/min (ref >60)
GFR calc non Af Amer: 57 mL/min — ABNORMAL LOW (ref >60)
GLUCOSE: 132 mg/dL — AB (ref 65–99)
POTASSIUM: 4 mmol/L (ref 3.5–5.1)
Sodium: 136 mmol/L (ref 135–145)

## 2015-09-02 LAB — I-STAT TROPONIN, ED
Troponin i, poc: 0.05 ng/mL (ref 0.00–0.08)
Troponin i, poc: 0.07 ng/mL (ref 0.00–0.08)

## 2015-09-02 MED ORDER — METHOCARBAMOL 500 MG PO TABS
500.0000 mg | ORAL_TABLET | Freq: Once | ORAL | Status: AC
  Administered 2015-09-02: 500 mg via ORAL
  Filled 2015-09-02: qty 1

## 2015-09-02 MED ORDER — METHOCARBAMOL 500 MG PO TABS: 500.0000 mg | ORAL_TABLET | Freq: Two times a day (BID) | ORAL | Status: DC

## 2015-09-02 MED ORDER — HYDROCODONE-ACETAMINOPHEN 5-325 MG PO TABS
1.0000 | ORAL_TABLET | Freq: Once | ORAL | Status: AC
  Administered 2015-09-02: 1 via ORAL
  Filled 2015-09-02: qty 1

## 2015-09-02 NOTE — ED Provider Notes (Signed)
CSN: [id number]     Arrival date & time 09/02/15  0302 History   First MD Initiated Contact with Patient 09/02/15 0715     Chief Complaint  Patient presents with  . Chest Pain  . Shoulder Pain   HPI  Joel Lawrence is a 55 year old male with a past medical history of anxiety, recurrent inguinal hernia and GERD presenting with left shoulder pain. He states he was walking through the mall holding a cup of coffee with his left hand when he felt a, sharp pain in his left shoulder. This occurred 3-4 days ago. He states that his left shoulder has been sore since since incident. He then went to the gym to work out 2 days ago and was gripping a weight and preparing to pull it towards him when he felt another pop in his shoulder. He states that his entire left arm went numb for a few seconds before resolving. He states that shoulder pain has been worse since this incident. He denies residual numbness in the arm but states that his hand feels swollen. He feels that he is having spasms in his shoulder and has restricted range of motion due to pain. He states that occasionally the pain radiates into his left posterior neck and across his chest. The pain is not exertional. The pain not have associated diaphoresis, dizziness, shortness of breath, nausea or vomiting. He denies history of shoulder injuries. He denies sensation of weakness in the left arm. He has not taken any over-the-counter medications prior to arrival. He has no other complaints at this time.   Past Medical History  Diagnosis Date  . Chest pain   . GERD (gastroesophageal reflux disease)   . PONV (postoperative nausea and vomiting)     not last time  . Anginal pain (HCC)   . Dysrhythmia     Palpations, when really stress  . Anxiety     body shakes  . Seizures (HCC)     hx of as a child no problems now   . Headache(784.0)     04/05/14- bad headache this week  . HOH (hard of hearing)    Past Surgical History  Procedure Laterality Date  .  Hernia repair  09/01/2007    Left Inguinal hernia  . Hernia repair  07/06/2010    recurrent left inguinal hernia  . Ventral hernia repair  10/20/2011    Procedure: LAPAROSCOPIC VENTRAL HERNIA;  Surgeon: Wilmon Arms. Corliss Skains, MD;  Location: [facility];  Service: General;  Laterality: N/A;  . Inguinal hernia repair  10/20/2011    Procedure: LAPAROSCOPIC INGUINAL HERNIA;  Surgeon: Wilmon Arms. Corliss Skains, MD;  Location: [facility];  Service: General;  Laterality: Left;  recurrent  . Inguinal hernia repair Right 04/19/2014    Procedure: RIGHT INGUINAL HERNIA REPAIR WITH MESH;  Surgeon: Manus Rudd, MD;  Location: [facility] OR;  Service: General;  Laterality: Right;  . Insertion of mesh Right 04/19/2014    Procedure: INSERTION OF MESH;  Surgeon: Manus Rudd, MD;  Location: [facility] OR;  Service: General;  Laterality: Right;   Family History  Problem Relation Age of Onset  . Cancer Father     lung  . Heart disease Father    Social History  Substance Use Topics  . Smoking status: Former Smoker    Quit date: 07/26/1978  . Smokeless tobacco: Never Used  . Alcohol Use: No    Review of Systems  Constitutional: Negative for fever, chills and diaphoresis.  HENT: Negative.  Respiratory: Negative for cough and shortness of breath.   Cardiovascular: Positive for chest pain. Negative for palpitations.  Gastrointestinal: Negative for nausea, vomiting and abdominal pain.  Musculoskeletal: Positive for arthralgias and neck pain. Negative for joint swelling.  Skin: Negative for color change.  Neurological: Positive for numbness (resolved). Negative for dizziness, weakness, light-headedness and headaches.  All other systems reviewed and are negative.     Allergies  Other  Home Medications   Prior to Admission medications   Medication Sig Start Date End Date Taking? Authorizing Provider  aspirin-acetaminophen-caffeine (EXCEDRIN MIGRAINE) [phone] MG tablet Take 2 tablets by mouth every 6 (six) hours as needed for  headache (pain).   Yes Historical Provider, MD  Omega-3 Fatty Acids (OMEGA 3 PO) Take 3 capsules by mouth 2 (two) times daily.   Yes Historical Provider, MD  testosterone cypionate (DEPOTESTOTERONE CYPIONATE) 200 MG/ML injection Inject 100 mg into the muscle every 7 (seven) days. tuesdays   Yes Historical Provider, MD  methocarbamol (ROBAXIN) 500 MG tablet Take 1 tablet (500 mg total) by mouth 2 (two) times daily. 09/02/15   Tonika Eden, PA-C   BP 142/87 mmHg  Pulse 83  Temp(Src) 98.4 F (36.9 C) (Oral)  Resp 23  SpO2 93% Physical Exam  Constitutional: He appears well-developed and well-nourished. No distress.  HENT:  Head: Normocephalic and atraumatic.  Eyes: Conjunctivae are normal. Right eye exhibits no discharge. Left eye exhibits no discharge. No scleral icterus.  Neck: Normal range of motion.  Tenderness over posterior, superior trapezius. No spasm. FROM of neck intact. No cervical spine tenderness.   Cardiovascular: Normal rate, regular rhythm and normal heart sounds.   Pulmonary/Chest: Effort normal and breath sounds normal. No respiratory distress.  Musculoskeletal:       Left shoulder: He exhibits decreased range of motion and tenderness. He exhibits no swelling, no deformity, no spasm, normal pulse and normal strength.       Arms: Generalized tenderness over the left shoulder extending to posterior neck and chest. Decreased range of motion secondary to pain. Patient is unable to extend or abduct his arm above approximately 90. Passive range of motion fully intact painful on movement greater than 90. Negative speed's test. Positive Neer's and Hawkin's.   Neurological: He is alert. Coordination normal.  5/5 grip and biceps strength b/l. He refuses strength testing of shoulder muscles secondary to pain. Sensation to light touch intact throughout.  Skin: Skin is warm and dry. He is not diaphoretic.  No skin changes over the upper extremities.  Psychiatric: He has a normal mood  and affect. His behavior is normal.  Nursing note and vitals reviewed.   ED Course  Procedures (including critical care time) Labs Review Labs Reviewed  BASIC METABOLIC PANEL - Abnormal; Notable for the following:    Glucose, Bld 132 (*)    Creatinine, Ser 1.36 (*)    GFR calc non Af Amer 57 (*)    All other components within normal limits  CBC  I-STAT TROPOININ, ED  I-STAT TROPOININ, ED    Imaging Review Dg Chest 2 View  09/02/2015  CLINICAL DATA:  Acute onset of left upper chest pain, radiating down the left shoulder and arm. Left upper back pain. Initial encounter. EXAM: CHEST  2 VIEW COMPARISON:  Chest radiograph performed 05/26/2004 FINDINGS: The lungs are well-aerated and clear. Pulmonary vascularity is at the upper limits of normal. There is no evidence of focal opacification, pleural effusion or pneumothorax. The heart is normal in size; the mediastinal  contour is within normal limits. No acute osseous abnormalities are seen. IMPRESSION: No acute cardiopulmonary process seen. Electronically Signed   By: Roanna Raider M.D.   On: 09/02/2015 04:02   Dg Shoulder Left  09/02/2015  CLINICAL DATA:  Dorsal shoulder pain after working out 2 days ago. EXAM: LEFT SHOULDER - 2+ VIEW COMPARISON:  Chest x-ray dated May 26, 2004 which included portions of the left shoulder. FINDINGS: The bones are adequately mineralized. The glenohumeral joint space is preserved. There is mild narrowing of the AC joint. The observed portions of the left clavicle and upper left ribs are normal. IMPRESSION: Mild degenerative change centered on the [facility] joint. There is no acute fracture nor dislocation. Electronically Signed   By: David  [place] M.D.   On: 09/02/2015 08:35   I have personally reviewed and evaluated these images and lab results as part of my medical decision-making.   EKG Interpretation   Date/Time:  Tuesday September 02 2015 03:23:34 EST Ventricular Rate:  95 PR Interval:  142 QRS Duration:  98 QT Interval:  356 QTC Calculation: 447 R Axis:   75 Text Interpretation:  Normal sinus rhythm Cannot rule out Inferior infarct  , age undetermined Abnormal ECG No significant change was found Confirmed  by HORTON  MD, COURTNEY ([id number]) on 09/02/2015 3:26:38 AM      MDM   Final diagnoses:  Left shoulder pain  Chest pain, unspecified chest pain type   Patient presenting with left shoulder pain x 3-4 days. No direct injury. Pt also notes the pain radiates into the posterior neck and across his chest. Shoulder pain is exacerbated by movement. Chest pain is not exertional or associated with SOB, diaphoresis, nausea or dizziness. Left arm is neurovascularly intact. Restricted ROM at approximately 90 degrees. Positive Hawkin's and Neer's. Chest pain and neck pain reproducible. Heart RRR without murmur. Lungs CTAB. Delta troponin negative with non-iscehmic ECG. CXR without abnormality. Patient shoulder X-Ray negative for obvious fracture or dislocation. Creatinine noted to be elevated to 1.36. Pt reports frequent, high intensity work outs. Pain managed in ED with robaxin and tylenol which improved his pain. Discussed RICE therapy and use of OTC pain relievers. Presentation and workup is not consistent with cardiac or pulmonary pain origin. Pt has been advised to return to the ED if CP becomes exertional, associated with diaphoresis or nausea, radiates to left jaw/arm, worsens or becomes concerning in any way. Pt advised to follow up with orthopedics if symptoms persist. Instructed pt to follow up with PCP in 1 week to recheck creatinine. Discussed case with Dr. Jeraldine Loots who agrees with the above plan to discharge. Return precautions discussed at bedside and given in discharge paperwork. Pt is stable for discharge.     Joel Gala Virgie Chery, PA-C 09/02/15 6045  Gerhard Munch, MD 09/06/15 [id number]

## 2015-09-02 NOTE — ED Notes (Signed)
Patient states he has pain in the back of the neck and down arm denies any pain in the chest. Patient states yesterday he was at the gym and heard something "pop" and started having numbness on the left side. Patient grips equal patient states sensation changes only in left hand states "it feel asleep."

## 2015-09-02 NOTE — ED Notes (Signed)
Patient with left shoulder pain, that radiates into his left neck and into his chest.  Patient states that it started the other day while walking through the mall.  Patient denies any shortness of breath, nausea or vomiting.  Patient is diaphoretic at this time.

## 2015-09-02 NOTE — Discharge Instructions (Signed)
Schedule an appointment with orthopedics (Dr. Eulah Pont) if your shoulder pain does not improve. Use tylenol, motrin and ice as needed for pain. Do not work out for 1 week. Your creatinine was high today. Stay hydrated and follow up with a PCP in 1 week for recheck.    Shoulder Pain The shoulder is the joint that connects your arms to your body. The bones that form the shoulder joint include the upper arm bone (humerus), the shoulder blade (scapula), and the collarbone (clavicle). The top of the humerus is shaped like a ball and fits into a rather flat socket on the scapula (glenoid cavity). A combination of muscles and strong, fibrous tissues that connect muscles to bones (tendons) support your shoulder joint and hold the ball in the socket. Small, fluid-filled sacs (bursae) are located in different areas of the joint. They act as cushions between the bones and the overlying soft tissues and help reduce friction between the gliding tendons and the bone as you move your arm. Your shoulder joint allows a wide range of motion in your arm. This range of motion allows you to do things like scratch your back or throw a ball. However, this range of motion also makes your shoulder more prone to pain from overuse and injury. Causes of shoulder pain can originate from both injury and overuse and usually can be grouped in the following four categories:  Redness, swelling, and pain (inflammation) of the tendon (tendinitis) or the bursae (bursitis).  Instability, such as a dislocation of the joint.  Inflammation of the joint (arthritis).  Broken bone (fracture). HOME CARE INSTRUCTIONS   Apply ice to the sore area.  Put ice in a plastic bag.  Place a towel between your skin and the bag.  Leave the ice on for 15-20 minutes, 3-4 times per day for the first 2 days, or as directed by your health care provider.  Stop using cold packs if they do not help with the pain.  If you have a shoulder sling or  immobilizer, wear it as long as your caregiver instructs. Only remove it to shower or bathe. Move your arm as little as possible, but keep your hand moving to prevent swelling.  Squeeze a soft ball or foam pad as much as possible to help prevent swelling.  Only take over-the-counter or prescription medicines for pain, discomfort, or fever as directed by your caregiver. SEEK MEDICAL CARE IF:   Your shoulder pain increases, or new pain develops in your arm, hand, or fingers.  Your hand or fingers become cold and numb.  Your pain is not relieved with medicines. SEEK IMMEDIATE MEDICAL CARE IF:   Your arm, hand, or fingers are numb or tingling.  Your arm, hand, or fingers are significantly swollen or turn white or blue. MAKE SURE YOU:   Understand these instructions.  Will watch your condition.  Will get help right away if you are not doing well or get worse.   This information is not intended to replace advice given to you by your health care provider. Make sure you discuss any questions you have with your health care provider.   Document Released: 04/21/2005 Document Revised: 08/02/2014 Document Reviewed: 11/04/2014 Elsevier Interactive Patient Education 2016 Elsevier Inc.  Chest Pain Observation It is often hard to give a specific diagnosis for the cause of chest pain. Among other possibilities your symptoms might be caused by inadequate oxygen delivery to your heart (angina). Angina that is not treated or evaluated can lead  to a heart attack (myocardial infarction) or death. Blood tests, electrocardiograms, and X-rays may have been done to help determine a possible cause of your chest pain. After evaluation and observation, your health care provider has determined that it is unlikely your pain was caused by an unstable condition that requires hospitalization. However, a full evaluation of your pain may need to be completed, with additional diagnostic testing as directed. It is very  important to keep your follow-up appointments. Not keeping your follow-up appointments could result in permanent heart damage, disability, or death. If there is any problem keeping your follow-up appointments, you must call your health care provider. HOME CARE INSTRUCTIONS  Due to the slight chance that your pain could be angina, it is important to follow your health care provider's treatment plan and also maintain a healthy lifestyle:  Maintain or work toward achieving a healthy weight.  Stay physically active and exercise regularly.  Decrease your salt intake.  Eat a balanced, healthy diet. Talk to a dietitian to learn about heart-healthy foods.  Increase your fiber intake by including whole grains, vegetables, fruits, and nuts in your diet.  Avoid situations that cause stress, anger, or depression.  Take medicines as advised by your health care provider. Report any side effects to your health care provider. Do not stop medicines or adjust the dosages on your own.  Quit smoking. Do not use nicotine patches or gum until you check with your health care provider.  Keep your blood pressure, blood sugar, and cholesterol levels within normal limits.  Limit alcohol intake to no more than 1 drink per day for women who are not pregnant and 2 drinks per day for men.  Do not abuse drugs. SEEK IMMEDIATE MEDICAL CARE IF: You have severe chest pain or pressure which may include symptoms such as:  You feel pain or pressure in your arms, neck, jaw, or back.  You have severe back or abdominal pain, feel sick to your stomach (nauseous), or throw up (vomit).  You are sweating profusely.  You are having a fast or irregular heartbeat.  You feel short of breath while at rest.  You notice increasing shortness of breath during rest, sleep, or with activity.  You have chest pain that does not get better after rest or after taking your usual medicine.  You wake from sleep with chest pain.  You  are unable to sleep because you cannot breathe.  You develop a frequent cough or you are coughing up blood.  You feel dizzy, faint, or experience extreme fatigue.  You develop severe weakness, dizziness, fainting, or chills. Any of these symptoms may represent a serious problem that is an emergency. Do not wait to see if the symptoms will go away. Call your local emergency services (911 in the [place] Do not drive yourself to the hospital. MAKE SURE YOU:  Understand these instructions.  Will watch your condition.  Will get help right away if you are not doing well or get worse.   This information is not intended to replace advice given to you by your health care provider. Make sure you discuss any questions you have with your health care provider.   Document Released: 08/14/2010 Document Revised: 07/17/2013 Document Reviewed: 01/11/2013 Elsevier Interactive Patient Education 2016 [employer].   Emergency Department Resource Guide 1) Find a Doctor and Pay Out of Pocket Although you won't have to find out who is covered by your insurance plan, it is a good idea to ask around and  get recommendations. You will then need to call the office and see if the doctor you have chosen will accept you as a new patient and what types of options they offer for patients who are self-pay. Some doctors offer discounts or will set up payment plans for their patients who do not have insurance, but you will need to ask so you aren't surprised when you get to your appointment.  2) Contact Your Local Health Department Not all health departments have doctors that can see patients for sick visits, but many do, so it is worth a call to see if yours does. If you don't know where your local health department is, you can check in your phone book. The CDC also has a tool to help you locate your state's health department, and many state websites also have listings of all of their local health departments.  3) Find a  Walk-in Clinic If your illness is not likely to be very severe or complicated, you may want to try a walk in clinic. These are popping up all over the country in pharmacies, drugstores, and shopping centers. They're usually staffed by nurse practitioners or physician assistants that have been trained to treat common illnesses and complaints. They're usually fairly quick and inexpensive. However, if you have serious medical issues or chronic medical problems, these are probably not your best option.  No Primary Care Doctor: - Call Health Connect at  [phone] - they can help you locate a primary care doctor that  accepts your insurance, provides certain services, etc. - Physician Referral Service- [phone]  Chronic Pain Problems: Organization         Address  Phone   Notes  [facility]  [phone] Patients need to be referred by their primary care doctor.   Medication Assistance: Organization         Address  Phone   Notes  [facility] Medication [facility] [address] [place] [place], [place] [address] [phone] --Must be a resident of [facility] -- Must have NO insurance coverage whatsoever (no Medicaid/ Medicare, etc.) -- The pt. MUST have a primary care doctor that directs their care regularly and follows them in the community   [facility]  [phone]   [employer]  [phone]    Agencies that provide inexpensive medical care: Organization         Address  Phone   Notes  [facility]  [phone]   [facility]    [phone]   [facility] [address] [place], [place] [address] [phone]   [facility] [address] [place], [place] [phone]   [facility]    [phone]   [facility]    [phone]   [facility] and [facility]  [address], [place] Phone:  [phone], Fax:  [phone] Hours of Operation:  9 am - 6 pm, M-F.  Also accepts Medicaid/Medicare and self-pay.  [facility]  [address], [place] Phone: [phone], Fax: [phone]. Hours of Operation:  8:30 am - 5:30 pm, M-F.  Also accepts Medicaid and self-pay.  [facility] [place] [address] [place] Phone: [phone]   [facility] [address] Natasha Bence [place], [place] [phone], Ext. [phone] Mondays & Thursdays: 7-9 AM.  First 15 patients are seen on a  first come, first serve basis.    Medicaid-accepting [facility] Providers:  Organization         Address  Phone   Notes  [facility] [address], [place] [phone] Also accepts self-pay patients.  [facility] [address] [place]urell [place], [place]  [phone]   [facility] [address], [place] [phone]   [facility] [address], [place] [phone]   Renaye Rakers [address], [place]   [phone] Only accepts [place] patients after they have their name applied to their card.   Self-Pay (no insurance) in [facility]:  Organization         Address  Phone   Notes  Sickle Cell Patients, [facility] [address] [place], [place] [phone]   [facility] [address] [place], [place] [phone]   [facility]  [address], [place] [phone]   [facility]/Dr. Osei-Bonsu  [address] [place] or [address], [place] [phone] Phone number for both [place] and [place] locations is the same.  [facility] [address] [place] [phone]   [facility] [address] [place] or [address] [phone]     [facility] [address], [place] [phone], phone; [phone], fax Sees patients 1st and 3rd Saturday of every month.  Must not qualify for public or private insurance (i.e. Medicaid, Medicare, [place] Health Choice, Veterans' Benefits)  Household income should be no more than 200% of the poverty level The clinic cannot treat you if you are pregnant or think you are pregnant  Sexually transmitted diseases are not treated at the clinic.    Dental Care: Organization         Address  Phone  Notes  [facility] Department of [facility] [address] [place], [place] [phone] Accepts children up to age 29 who are enrolled in [place] or [place] Health Choice; pregnant women with a Medicaid card; and children who have applied for Medicaid or [place] Health Choice, but were declined, whose parents can pay a reduced fee at time of service.  [facility] Department of [facility]  [address], [place] [phone] Accepts children up to age 40 who are enrolled in [place] or [place] Health Choice; pregnant women with a Medicaid card; and children who have applied for Medicaid or [place] Health Choice, but were declined, whose parents can pay a reduced fee at time of service.  [facility]  [address] [place], [place] [phone] Patients are seen by appointment only. Walk-ins are not accepted. [facility] will see patients 72 years of age and older. Monday - Tuesday (8am-5pm) Most Wednesdays (8:30-5pm) $30 per visit, cash only  [facility]  [address], [facility] [phone] Patients are seen by appointment only. Walk-ins are not accepted. [facility] will see patients 85 years of age and older. One Wednesday Evening (Monthly: Volunteer Based).  $30 per visit, cash only  [employer]  [phone] for adults; Children under age 56, call  [facility] at [phone]. Children  aged 70-14, please call [phone] to request a pediatric application.  Dental services are provided in all areas of dental care including fillings, crowns and bridges, complete and partial dentures, implants, gum treatment, root canals, and extractions. Preventive care is also provided. Treatment is provided to both adults and children. Patients are selected via a lottery and there is often a waiting list.   [facility] [address], [place]  [phone] [email or web address]   [facility] [address] [place], [place] [phone], Ext. [phone] Second and Fourth Thursday of each month, opens at 6:30 AM; Clinic ends at 9 AM.  Patients are seen on a first-come first-served basis, and a limited number are seen during each clinic.   [facility]  [address] Ether Griffins [place], [place] [phone]   Eligibility Requirements You must have lived in [place], [place], or [place] counties for at least the last three months.   You cannot be eligible for state or federal sponsored [place], including [employer], [place], or [employer].   You generally cannot be eligible for healthcare insurance through your employer.    How to apply: Eligibility screenings are held every Tuesday and Wednesday afternoon from 1:00 pm until 4:00 pm. You do not need an appointment for the interview!  [facility] [address] [place], [place] [phone]   [facility] Health Department  [phone]   [facility] Health Department  [phone]   [facility]  [phone]

## 2015-11-20 ENCOUNTER — Encounter (HOSPITAL_COMMUNITY): Payer: Self-pay | Admitting: Emergency Medicine

## 2015-11-20 ENCOUNTER — Emergency Department (HOSPITAL_COMMUNITY)
Admission: EM | Admit: 2015-11-20 | Discharge: 2015-11-20 | Disposition: A | Discharge status: Home / Self Care | Payer: Self-pay | Attending: Emergency Medicine | Admitting: Emergency Medicine

## 2015-11-20 ENCOUNTER — Emergency Department (HOSPITAL_COMMUNITY): Payer: Self-pay

## 2015-11-20 DIAGNOSIS — R0602 Shortness of breath: Secondary | ICD-10-CM | POA: Insufficient documentation

## 2015-11-20 DIAGNOSIS — Z87891 Personal history of nicotine dependence: Secondary | ICD-10-CM | POA: Insufficient documentation

## 2015-11-20 DIAGNOSIS — Z79899 Other long term (current) drug therapy: Secondary | ICD-10-CM | POA: Insufficient documentation

## 2015-11-20 DIAGNOSIS — R05 Cough: Secondary | ICD-10-CM | POA: Insufficient documentation

## 2015-11-20 DIAGNOSIS — H9190 Unspecified hearing loss, unspecified ear: Secondary | ICD-10-CM | POA: Insufficient documentation

## 2015-11-20 DIAGNOSIS — Z8679 Personal history of other diseases of the circulatory system: Secondary | ICD-10-CM | POA: Insufficient documentation

## 2015-11-20 DIAGNOSIS — Z8719 Personal history of other diseases of the digestive system: Secondary | ICD-10-CM | POA: Insufficient documentation

## 2015-11-20 DIAGNOSIS — R066 Hiccough: Secondary | ICD-10-CM | POA: Insufficient documentation

## 2015-11-20 DIAGNOSIS — Z8659 Personal history of other mental and behavioral disorders: Secondary | ICD-10-CM | POA: Insufficient documentation

## 2015-11-20 DIAGNOSIS — R110 Nausea: Secondary | ICD-10-CM | POA: Insufficient documentation

## 2015-11-20 LAB — BASIC METABOLIC PANEL
Anion gap: 10 (ref 5–15)
BUN: 12 mg/dL (ref 6–20)
CALCIUM: 8.9 mg/dL (ref 8.9–10.3)
CHLORIDE: 102 mmol/L (ref 101–111)
CO2: 25 mmol/L (ref 22–32)
CREATININE: 1.31 mg/dL — AB (ref 0.61–1.24)
GFR, EST NON AFRICAN AMERICAN: 59 mL/min — AB (ref >60)
Glucose, Bld: 148 mg/dL — ABNORMAL HIGH (ref 65–99)
Potassium: 4.4 mmol/L (ref 3.5–5.1)
SODIUM: 137 mmol/L (ref 135–145)

## 2015-11-20 LAB — CBC
HCT: 46.4 % (ref 39.0–52.0)
Hemoglobin: 15 g/dL (ref 13.0–17.0)
MCH: 29.8 pg (ref 26.0–34.0)
MCHC: 32.3 g/dL (ref 30.0–36.0)
MCV: 92.1 fL (ref 78.0–100.0)
PLATELETS: 290 10*3/uL (ref 150–400)
RBC: 5.04 MIL/uL (ref 4.22–5.81)
RDW: 15.5 % (ref 11.5–15.5)
WBC: 12.7 10*3/uL — ABNORMAL HIGH (ref 4.0–10.5)

## 2015-11-20 LAB — I-STAT TROPONIN, ED
TROPONIN I, POC: 0.04 ng/mL (ref 0.00–0.08)
TROPONIN I, POC: 0.02 ng/mL (ref 0.00–0.08)

## 2015-11-20 MED ORDER — GI COCKTAIL ~~LOC~~
30.0000 mL | Freq: Once | ORAL | Status: AC
  Administered 2015-11-20: 30 mL via ORAL
  Filled 2015-11-20: qty 30

## 2015-11-20 MED ORDER — CHLORPROMAZINE HCL 25 MG PO TABS: 25.0000 mg | ORAL_TABLET | Freq: Three times a day (TID) | ORAL | Status: DC | PRN

## 2015-11-20 MED ORDER — OMEPRAZOLE 20 MG PO CPDR: 20.0000 mg | DELAYED_RELEASE_CAPSULE | Freq: Every day | ORAL | Status: DC

## 2015-11-20 MED ORDER — SODIUM CHLORIDE 0.9 % IV SOLN
25.0000 mg | Freq: Once | INTRAVENOUS | Status: AC
  Administered 2015-11-20: 25 mg via INTRAVENOUS
  Filled 2015-11-20: qty 1

## 2015-11-20 MED ORDER — RANITIDINE HCL 150 MG PO TABS: 150.0000 mg | ORAL_TABLET | Freq: Two times a day (BID) | ORAL | Status: DC

## 2015-11-20 MED ORDER — FAMOTIDINE IN NACL 20-0.9 MG/50ML-% IV SOLN
20.0000 mg | Freq: Once | INTRAVENOUS | Status: AC
  Administered 2015-11-20: 20 mg via INTRAVENOUS
  Filled 2015-11-20: qty 50

## 2015-11-20 NOTE — ED Notes (Signed)
Pt. reports intermittent central chest pain with SOB , cough , nausea and diaphoresis onset this week .

## 2015-11-20 NOTE — Discharge Instructions (Signed)
Hiccups °A hiccup is the result of a sudden shortening of the muscle below your lungs (diaphragm). This movement of your diaphragm causes a sudden inhalation followed by the closing of your vocal cords, which causes the hiccup sound. Most people get the hiccups. Typically, hiccups last only a short amount of time.  °There are three types of hiccups:  °· Benign. These hiccups last less than 48 hours.   °· Persistent. These hiccups last more than 48 hours, but less than 1 month.   °· Intractable. These hiccups last more than 1 month.   °A hiccup is a reflex. You cannot control reflexes.  °HOME CARE INSTRUCTIONS  °Watch your hiccups for any changes. The following actions may help to lessen any discomfort that you are feeling: °· Eat small meals.   °· Limit alcohol intake to no more than 1 drink per day for nonpregnant women and 2 drinks per day for men. One drink equals 12 oz of beer, 5 oz of wine, or 1½ oz of hard liquor. °· Limit drinking carbonated or fizzy drinks, such as soda. °· Eat and chew your food slowly.   °· Avoid eating or drinking hot or spicy foods and drinks. °· Take medicines only as directed by your health care provider.   °SEEK MEDICAL CARE IF:  °· Your hiccups last for more than 48 hours.   °· Your hiccups do not improve with treatment. °· You cannot sleep or eat due to the hiccups.   °· You have unexpected weight loss due to the hiccups.   °· You have a fever.   °· You have trouble breathing or swallowing.   °· You develop severe pain in your abdomen. °· You develop numbness, tingling, or weakness. °  °This information is not intended to replace advice given to you by your health care provider. Make sure you discuss any questions you have with your health care provider. °  °Document Released: 09/20/2001 Document Revised: 11/26/2014 Document Reviewed: 07/08/2014 °Elsevier Interactive Patient Education ©2016 Elsevier Inc. ° °

## 2015-11-20 NOTE — ED Provider Notes (Addendum)
CSN: [id number]     Arrival date & time 11/20/15  0125 History   By signing my name below, I, Arlan Organ, attest that this documentation has been prepared under the direction and in the presence of Gilda Crease, MD.  Electronically Signed: Arlan Organ, ED Scribe. 11/20/2015. 2:51 AM.   Chief Complaint  Patient presents with  . Chest Pain   The history is provided by the patient. No language interpreter was used.    HPI Comments: Joel Lawrence is a 55 y.o. male with a PMHx of GERD and anxiety who presents to the Emergency Department complaining of intermittent, ongoing central chest pain with associated shortness of breath x 2-3 days. Pt states he initially noted ongoing hiccups and belching x 3 days which then progressed to pressure like pain. Pt also reports an episode of nausea and cough after chest pain intensified earlier this evening. No aggravating or alleviating factors at this time. No OTC medications or home remedies attempted prior to arrival. No recent fever, chills, vomiting, or abdominal pain. No prior personal history of cardiac disease. However, pt sates his Father had his first heart attack at a young age and uncle had a heart attack at the age of 26. No known allergies to medications.  PCP: PROVIDER NOT IN SYSTEM    Past Medical History  Diagnosis Date  . Chest pain   . GERD (gastroesophageal reflux disease)   . PONV (postoperative nausea and vomiting)     not last time  . Anginal pain (HCC)   . Dysrhythmia     Palpations, when really stress  . Anxiety     body shakes  . Seizures (HCC)     hx of as a child no problems now   . Headache(784.0)     04/05/14- bad headache this week  . HOH (hard of hearing)    Past Surgical History  Procedure Laterality Date  . Hernia repair  09/01/2007    Left Inguinal hernia  . Hernia repair  07/06/2010    recurrent left inguinal hernia  . Ventral hernia repair  10/20/2011    Procedure: LAPAROSCOPIC VENTRAL HERNIA;   Surgeon: Wilmon Arms. Corliss Skains, MD;  Location: [facility];  Service: General;  Laterality: N/A;  . Inguinal hernia repair  10/20/2011    Procedure: LAPAROSCOPIC INGUINAL HERNIA;  Surgeon: Wilmon Arms. Corliss Skains, MD;  Location: [facility];  Service: General;  Laterality: Left;  recurrent  . Inguinal hernia repair Right 04/19/2014    Procedure: RIGHT INGUINAL HERNIA REPAIR WITH MESH;  Surgeon: Manus Rudd, MD;  Location: [facility];  Service: General;  Laterality: Right;  . Insertion of mesh Right 04/19/2014    Procedure: INSERTION OF MESH;  Surgeon: Manus Rudd, MD;  Location: [facility];  Service: General;  Laterality: Right;   Family History  Problem Relation Age of Onset  . Cancer Father     lung  . Heart disease Father    Social History  Substance Use Topics  . Smoking status: Former Smoker    Quit date: 07/26/1978  . Smokeless tobacco: Never Used  . Alcohol Use: No    Review of Systems  Constitutional: Negative for fever and chills.  Respiratory: Positive for cough and shortness of breath.   Cardiovascular: Positive for chest pain.  Gastrointestinal: Positive for nausea. Negative for vomiting and abdominal pain.  Neurological: Negative for headaches.  Psychiatric/Behavioral: Negative for confusion.  All other systems reviewed and are negative.     Allergies  Other  Home Medications   Prior to Admission medications   Medication Sig Start Date End Date Taking? Authorizing Provider  aspirin-acetaminophen-caffeine (EXCEDRIN MIGRAINE) [phone] MG tablet Take 2 tablets by mouth every 6 (six) hours as needed for headache (pain).    Historical Provider, MD  methocarbamol (ROBAXIN) 500 MG tablet Take 1 tablet (500 mg total) by mouth 2 (two) times daily. 09/02/15   Stevi Barrett, PA-C  Omega-3 Fatty Acids (OMEGA 3 PO) Take 3 capsules by mouth 2 (two) times daily.    Historical Provider, MD  testosterone cypionate (DEPOTESTOTERONE CYPIONATE) 200 MG/ML injection Inject 100 mg into the muscle every 7 (seven)  days. tuesdays    Historical Provider, MD   Triage Vitals: BP 133/79 mmHg  Pulse 98  Temp(Src) 98.2 F (36.8 C) (Oral)  Resp 22  Ht 6' (1.829 m)  Wt 227 lb 4 oz (103.08 kg)  BMI 30.81 kg/m2  SpO2 96%   Physical Exam  Constitutional: He is oriented to person, place, and time. He appears well-developed and well-nourished. No distress.  HENT:  Head: Normocephalic and atraumatic.  Right Ear: Hearing normal.  Left Ear: Hearing normal.  Nose: Nose normal.  Mouth/Throat: Oropharynx is clear and moist and mucous membranes are normal.  Eyes: Conjunctivae and EOM are normal. Pupils are equal, round, and reactive to light.  Neck: Normal range of motion. Neck supple.  Cardiovascular: Regular rhythm, S1 normal and S2 normal.  Exam reveals no gallop and no friction rub.   No murmur heard. Pulmonary/Chest: Effort normal and breath sounds normal. No respiratory distress. He exhibits no tenderness.  Abdominal: Soft. Normal appearance and bowel sounds are normal. There is no hepatosplenomegaly. There is no tenderness. There is no rebound, no guarding, no tenderness at McBurney's point and negative Murphy's sign. No hernia.  Musculoskeletal: Normal range of motion.  Neurological: He is alert and oriented to person, place, and time. He has normal strength. No cranial nerve deficit or sensory deficit. Coordination normal. GCS eye subscore is 4. GCS verbal subscore is 5. GCS motor subscore is 6.  Skin: Skin is warm, dry and intact. No rash noted. No cyanosis.  Psychiatric: He has a normal mood and affect. His speech is normal and behavior is normal. Thought content normal.  Nursing note and vitals reviewed.   ED Course  Procedures (including critical care time)  DIAGNOSTIC STUDIES: Oxygen Saturation is 96% on RA, adequate by my interpretation.    COORDINATION OF CARE: 2:41 AM- Will order blood work, EKG, and CXR. Discussed treatment plan with pt at bedside and pt agreed to plan.     Labs  Review Labs Reviewed  BASIC METABOLIC PANEL - Abnormal; Notable for the following:    Glucose, Bld 148 (*)    Creatinine, Ser 1.31 (*)    GFR calc non Af Amer 59 (*)    All other components within normal limits  CBC - Abnormal; Notable for the following:    WBC 12.7 (*)    All other components within normal limits  I-STAT TROPOININ, ED    Imaging Review Dg Chest 2 View  11/20/2015  CLINICAL DATA:  Chest pain EXAM: CHEST  2 VIEW COMPARISON:  09/02/2015 FINDINGS: Normal heart size and mediastinal contours. Linear interface is along the left heart border are similar to previous -no convincing pneumomediastinum. Mild hyperinflation. No acute infiltrate or edema. No effusion or pneumothorax. No acute osseous findings. IMPRESSION: Stable.  No acute finding. Electronically Signed   By: Kathrynn Ducking.D.  On: 11/20/2015 01:55   I have personally reviewed and evaluated these images and lab results as part of my medical decision-making.   EKG Interpretation None      MDM   Final diagnoses:  None  Hiccups  Patient presents to the emergency department for evaluation of chest discomfort. Patient reports that he has had hiccups chronically over the last 3-4 days. He reports that his entire abdomen and chest is sore secondary to the hiccups. He reports that hiccups or forceful and severe. He has now developed some heaviness over his chest and shortness of breath associated with the symptoms. It seems that he is having hiccups cause spasm and while he is hiccuping he cannot breathe. There is no continuous shortness of breath. Patient does have a significant family history of heart disease, otherwise no significant cardiac risk factors.  He is unremarkable. He has had 2 troponins that are negative. He does have a history of reflux. Patient treated with GI cocktail, Pepcid as well as Thorazine. Hiccups have resolved and the patient is feeling improved. Will initiate zantac and short course of  proton pump inhibitor and have patient follow-up with gastroenterology.  I personally performed the services described in this documentation, which was scribed in my presence. The recorded information has been reviewed and is accurate.   Gilda Creasehristopher J Dakarai Mcglocklin, MD 11/20/15 [phone]  Gilda Creasehristopher J Kamsiyochukwu Spickler, MD 11/20/15 [phone]

## 2016-08-24 ENCOUNTER — Encounter (HOSPITAL_COMMUNITY): Payer: Self-pay

## 2016-08-24 ENCOUNTER — Emergency Department (HOSPITAL_COMMUNITY): Payer: Managed Care, Other (non HMO)

## 2016-08-24 ENCOUNTER — Emergency Department (HOSPITAL_COMMUNITY)
Admission: EM | Admit: 2016-08-24 | Discharge: 2016-08-24 | Disposition: A | Discharge status: Home / Self Care | Payer: Managed Care, Other (non HMO) | Attending: Emergency Medicine | Admitting: Emergency Medicine

## 2016-08-24 DIAGNOSIS — Z79899 Other long term (current) drug therapy: Secondary | ICD-10-CM | POA: Insufficient documentation

## 2016-08-24 DIAGNOSIS — R2681 Unsteadiness on feet: Secondary | ICD-10-CM | POA: Insufficient documentation

## 2016-08-24 DIAGNOSIS — Z87891 Personal history of nicotine dependence: Secondary | ICD-10-CM | POA: Insufficient documentation

## 2016-08-24 DIAGNOSIS — M6281 Muscle weakness (generalized): Secondary | ICD-10-CM | POA: Insufficient documentation

## 2016-08-24 DIAGNOSIS — R531 Weakness: Secondary | ICD-10-CM

## 2016-08-24 DIAGNOSIS — Z5181 Encounter for therapeutic drug level monitoring: Secondary | ICD-10-CM | POA: Insufficient documentation

## 2016-08-24 LAB — I-STAT CHEM 8, ED
BUN: 15 mg/dL (ref 6–20)
CHLORIDE: 103 mmol/L (ref 101–111)
Calcium, Ion: 1.12 mmol/L — ABNORMAL LOW (ref 1.15–1.40)
Creatinine, Ser: 1.3 mg/dL — ABNORMAL HIGH (ref 0.61–1.24)
Glucose, Bld: 90 mg/dL (ref 65–99)
HEMATOCRIT: 54 % — AB (ref 39.0–52.0)
Hemoglobin: 18.4 g/dL — ABNORMAL HIGH (ref 13.0–17.0)
POTASSIUM: 4.2 mmol/L (ref 3.5–5.1)
SODIUM: 140 mmol/L (ref 135–145)
TCO2: 27 mmol/L (ref 0–100)

## 2016-08-24 LAB — DIFFERENTIAL
BASOS PCT: 1 %
Basophils Absolute: 0 10*3/uL (ref 0.0–0.1)
EOS PCT: 1 %
Eosinophils Absolute: 0.1 10*3/uL (ref 0.0–0.7)
LYMPHS PCT: 15 %
Lymphs Abs: 1.1 10*3/uL (ref 0.7–4.0)
MONO ABS: 0.5 10*3/uL (ref 0.1–1.0)
Monocytes Relative: 7 %
NEUTROS ABS: 5.7 10*3/uL (ref 1.7–7.7)
NEUTROS PCT: 76 %

## 2016-08-24 LAB — CBC
HCT: 52.8 % — ABNORMAL HIGH (ref 39.0–52.0)
Hemoglobin: 17.6 g/dL — ABNORMAL HIGH (ref 13.0–17.0)
MCH: 31.2 pg (ref 26.0–34.0)
MCHC: 33.3 g/dL (ref 30.0–36.0)
MCV: 93.5 fL (ref 78.0–100.0)
PLATELETS: 278 10*3/uL (ref 150–400)
RBC: 5.65 MIL/uL (ref 4.22–5.81)
RDW: 15.5 % (ref 11.5–15.5)
WBC: 7.4 10*3/uL (ref 4.0–10.5)

## 2016-08-24 LAB — COMPREHENSIVE METABOLIC PANEL
ALBUMIN: 3.7 g/dL (ref 3.5–5.0)
ALK PHOS: 42 U/L (ref 38–126)
ALT: 59 U/L (ref 17–63)
ANION GAP: 9 (ref 5–15)
AST: 45 U/L — ABNORMAL HIGH (ref 15–41)
BUN: 13 mg/dL (ref 6–20)
CALCIUM: 9 mg/dL (ref 8.9–10.3)
CHLORIDE: 103 mmol/L (ref 101–111)
CO2: 24 mmol/L (ref 22–32)
Creatinine, Ser: 1.19 mg/dL (ref 0.61–1.24)
GFR calc non Af Amer: >60 mL/min (ref >60)
GLUCOSE: 90 mg/dL (ref 65–99)
Potassium: 4.2 mmol/L (ref 3.5–5.1)
SODIUM: 136 mmol/L (ref 135–145)
Total Bilirubin: 0.9 mg/dL (ref 0.3–1.2)
Total Protein: 6.7 g/dL (ref 6.5–8.1)

## 2016-08-24 LAB — PROTIME-INR
INR: 0.97
PROTHROMBIN TIME: 12.9 s (ref 11.4–15.2)

## 2016-08-24 LAB — APTT: aPTT: 29 seconds (ref 24–36)

## 2016-08-24 LAB — I-STAT TROPONIN, ED: Troponin i, poc: 0.02 ng/mL (ref 0.00–0.08)

## 2016-08-24 NOTE — ED Provider Notes (Signed)
[facility]ENC[facility] Provider Note   CSN: [id number] Arrival date & time: 08/24/16  1935     History   Chief Complaint Chief Complaint  Patient presents with  . Extremity Weakness  . Gait Problem  . Visual Field Change    HPI Joel Lawrence is a 56 y.o. male.  The history is provided by the patient.  Extremity Weakness  This is a new problem. Episode onset: 1-2 weeks ago. The problem occurs constantly. The problem has been gradually worsening. Pertinent negatives include no chest pain, no abdominal pain, no headaches and no shortness of breath. Nothing aggravates the symptoms. Nothing relieves the symptoms. He has tried nothing for the symptoms.   Noted weakness during lifting weights; states that he has not been able to lift his normal weight with the RUE (can only lift 10lbs vs 90lbs he could previously lift). Also noted atrophy of right sided muscles. States that people tell him that his gait is off and favoring to right side.  Past Medical History:  Diagnosis Date  . Anginal pain (HCC)   . Anxiety    body shakes  . Chest pain   . Dysrhythmia    Palpations, when really stress  . GERD (gastroesophageal reflux disease)   . Headache(784.0)    04/05/14- bad headache this week  . HOH (hard of hearing)   . PONV (postoperative nausea and vomiting)    not last time  . Seizures (HCC)    hx of as a child no problems now     Patient Active Problem List   Diagnosis Date Noted  . Pre-op evaluation 04/11/2014  . GERD (gastroesophageal reflux disease) 04/11/2014  . Right inguinal hernia 03/18/2014  . Ventral hernia 09/16/2011  . Recurrent left inguinal hernia 09/16/2011    Past Surgical History:  Procedure Laterality Date  . HERNIA REPAIR  09/01/2007   Left Inguinal hernia  . HERNIA REPAIR  07/06/2010   recurrent left inguinal hernia  . INGUINAL HERNIA REPAIR  10/20/2011   Procedure: LAPAROSCOPIC INGUINAL HERNIA;  Surgeon: Wilmon Arms. Corliss Skains, MD;  Location: [facility];   Service: General;  Laterality: Left;  recurrent  . INGUINAL HERNIA REPAIR Right 04/19/2014   Procedure: RIGHT INGUINAL HERNIA REPAIR WITH MESH;  Surgeon: Manus Rudd, MD;  Location: [facility];  Service: General;  Laterality: Right;  . INSERTION OF MESH Right 04/19/2014   Procedure: INSERTION OF MESH;  Surgeon: Manus Rudd, MD;  Location: [facility];  Service: General;  Laterality: Right;  . VENTRAL HERNIA REPAIR  10/20/2011   Procedure: LAPAROSCOPIC VENTRAL HERNIA;  Surgeon: Wilmon Arms. Corliss Skains, MD;  Location: [facility];  Service: General;  Laterality: N/A;       Home Medications    Prior to Admission medications   Medication Sig Start Date End Date Taking? Authorizing Provider  MILK THISTLE PO Take 1 tablet by mouth 2 (two) times daily.   Yes Historical Provider, MD  Omega-3 Fatty Acids (OMEGA 3 PO) Take 3 capsules by mouth 2 (two) times daily.   Yes Historical Provider, MD  testosterone cypionate (DEPOTESTOTERONE CYPIONATE) 200 MG/ML injection Inject 100 mg into the muscle every 7 (seven) days. tuesdays   Yes Historical Provider, MD  chlorproMAZINE (THORAZINE) 25 MG tablet Take 1-2 tablets (25-50 mg total) by mouth 3 (three) times daily as needed for hiccoughs. Patient not taking: Reported on 08/24/2016 11/20/15   Gilda Crease, MD  omeprazole (PRILOSEC) 20 MG capsule Take 1 capsule (20 mg total) by mouth daily. Patient  not taking: Reported on 08/24/2016 11/20/15   Gilda Creasehristopher J Pollina, MD  ranitidine (ZANTAC) 150 MG tablet Take 1 tablet (150 mg total) by mouth 2 (two) times daily. Patient not taking: Reported on 08/24/2016 11/20/15   Gilda Creasehristopher J Pollina, MD    Family History Family History  Problem Relation Age of Onset  . Cancer Father     lung  . Heart disease Father     Social History Social History  Substance Use Topics  . Smoking status: Former Smoker    Quit date: 07/26/1978  . Smokeless tobacco: Never Used  . Alcohol use No     Allergies   Other   Review of  Systems Review of Systems  Respiratory: Negative for shortness of breath.   Cardiovascular: Negative for chest pain.  Gastrointestinal: Negative for abdominal pain.  Musculoskeletal: Positive for extremity weakness.  Neurological: Negative for headaches.   Ten systems are reviewed and are negative for acute change except as noted in the HPI   Physical Exam Updated Vital Signs BP 140/89 (BP Location: Right Arm)   Pulse 85   Temp 98.5 F (36.9 C) (Oral)   Resp 20   Ht 6' (1.829 m)   Wt 240 lb (108.9 kg)   SpO2 100%   BMI 32.55 kg/m   Physical Exam  Constitutional: He is oriented to person, place, and time. He appears well-developed and well-nourished. No distress.  HENT:  Head: Normocephalic and atraumatic.  Nose: Nose normal.  Eyes: Conjunctivae and EOM are normal. Pupils are equal, round, and reactive to light. Right eye exhibits no discharge. Left eye exhibits no discharge. No scleral icterus.  Neck: Normal range of motion. Neck supple.  Cardiovascular: Normal rate and regular rhythm.  Exam reveals no gallop and no friction rub.   No murmur heard. Pulmonary/Chest: Effort normal and breath sounds normal. No stridor. No respiratory distress. He has no rales.  Abdominal: Soft. He exhibits no distension. There is no tenderness.  Musculoskeletal: He exhibits no edema or tenderness.  Neurological: He is alert and oriented to person, place, and time.  Mental Status: Alert and oriented to person, place, and time. Attention and concentration normal. Speech clear. Recent memory is intac  Cranial Nerves  II Visual Fields: Intact to confrontation. Visual fields intact. III, IV, VI: Pupils equal and reactive to light and near. Full eye movement without nystagmus  V Facial Sensation: Normal. No weakness of masticatory muscles  VII: No facial weakness or asymmetry  VIII Auditory Acuity: Grossly normal  IX/X: The uvula is midline; the palate elevates symmetrically  XI: Normal  sternocleidomastoid and trapezius strength  XII: The tongue is midline. No atrophy or fasciculations.   Motor System: Muscle Strength: 4.5/5 with RUE and RLE. 5/5 with left extremities. No pronation or drift.  Muscle Tone: Tone and muscle bulk are normal in the upper and lower extremities.   Reflexes: DTRs: 2+ and symmetrical in all four extremities. Plantar responses are flexor bilaterally.  Coordination: Intact finger-to-nose, heel-to-shin, and rapid alternating movements. No tremor.  Sensation: Intact to light touch, and pinprick. Negative Romberg test.  Gait: Routine and tandem gait are normal    Skin: Skin is warm and dry. No rash noted. He is not diaphoretic. No erythema.  Psychiatric: He has a normal mood and affect.  Vitals reviewed.    ED Treatments / Results  Labs (all labs ordered are listed, but only abnormal results are displayed) Labs Reviewed  CBC - Abnormal; Notable for the following:  Result Value   Hemoglobin 17.6 (*)    HCT 52.8 (*)    All other components within normal limits  COMPREHENSIVE METABOLIC PANEL - Abnormal; Notable for the following:    AST 45 (*)    All other components within normal limits  I-STAT CHEM 8, ED - Abnormal; Notable for the following:    Creatinine, Ser 1.30 (*)    Calcium, Ion 1.12 (*)    Hemoglobin 18.4 (*)    HCT 54.0 (*)    All other components within normal limits  PROTIME-INR  APTT  DIFFERENTIAL  I-STAT TROPOININ, ED  CBG MONITORING, ED    EKG  EKG Interpretation None       Radiology Ct Head Wo Contrast  Result Date: 08/24/2016 CLINICAL DATA:  Unsteady gait for 2 weeks.  Right-sided weakness. EXAM: CT HEAD WITHOUT CONTRAST TECHNIQUE: Contiguous axial images were obtained from the base of the skull through the vertex without intravenous contrast. COMPARISON:  None. FINDINGS: Brain: There is no intracranial hemorrhage, mass or evidence of acute infarction. There is no extra-axial fluid collection. Gray matter and  white matter appear normal. Cerebral volume is normal for age. Brainstem and posterior fossa are unremarkable. The CSF spaces appear normal. Vascular: No hyperdense vessel or unexpected calcification. Skull: Normal. Negative for fracture or focal lesion. Sinuses/Orbits: No acute finding. Other: None. IMPRESSION: Normal brain Electronically Signed   By: Ellery Plunk M.D.   On: 08/24/2016 20:28    Procedures Procedures (including critical care time)  Medications Ordered in ED Medications - No data to display   Initial Impression / Assessment and Plan / ED Course  I have reviewed the triage vital signs and the nursing notes.  Pertinent labs & imaging results that were available during my care of the patient were reviewed by me and considered in my medical decision making (see chart for details).     CT without evidence of remote or subacute infarct which should be present given the onset of his symptoms approximately 2-3 weeks ago. Patient denies any neck pain, bowel or bladder incontinence suggestive of any spinal cord stenosis. Labs grossly reassuring. Safe for discharge with strict return precautions. Instructed to follow up closely with neurology for additional workup including MRI.    Final Clinical Impressions(s) / ED Diagnoses   Final diagnoses:  Right sided weakness   Disposition: Discharge  Condition: Good  I have discussed the results, Dx and Tx plan with the patient who expressed understanding and agree(s) with the plan. Discharge instructions discussed at great length. The patient was given strict return precautions who verbalized understanding of the instructions. No further questions at time of discharge.    Discharge Medication List as of 08/24/2016 10:28 PM      Follow Up: [facility] NEUR[facility] [address] [place], [address] [place] [address] [phone] Schedule an appointment as soon as possible for a visit  For close follow up to assess for  right sided weakness  [facility] [address]     [address] [place] [address] [phone]  For close follow up to assess for right sided weakness      Nira Conn, MD 08/25/16 0040

## 2016-08-24 NOTE — ED Triage Notes (Signed)
Pt states he started having weakness in left side; pt states he noticed gait is leaning towards right side; pt also states some vision changes; Pt states vision becomes like tunnel vision; pt denies pain; Pt states he is very active in gym and noticed he could not pick up as much weight on left side as normal; pt states he notice right arm has decreased in size; Pt a&o x 4 on arrival.

## 2016-08-24 NOTE — ED Notes (Signed)
Pt ambulated to the treatment room without difficulty. No gait issues noted at this time.

## 2016-10-11 ENCOUNTER — Ambulatory Visit (INDEPENDENT_AMBULATORY_CARE_PROVIDER_SITE_OTHER): Payer: Self-pay | Admitting: Diagnostic Neuroimaging

## 2016-10-11 ENCOUNTER — Encounter: Payer: Self-pay | Admitting: Diagnostic Neuroimaging

## 2016-10-11 VITALS — BP 149/74 | HR 77 | Ht 71.0 in | Wt 225.6 lb

## 2016-10-11 DIAGNOSIS — R29898 Other symptoms and signs involving the musculoskeletal system: Secondary | ICD-10-CM

## 2016-10-11 DIAGNOSIS — M62521 Muscle wasting and atrophy, not elsewhere classified, right upper arm: Secondary | ICD-10-CM

## 2016-10-11 NOTE — Progress Notes (Signed)
[facility]  PATIENT: Joel Lawrence DOB: 03/10/1961  REFERRING CLINICIAN: ER / [facility] HISTORY FROM: patient  REASON FOR VISIT: new consult    HISTORICAL  CHIEF COMPLAINT:  Chief Complaint  Patient presents with  . Extremity weakness    rm 7, New Pt, ED referral, "started in Nov 2017; continuous since then and getting worse; no pain; my whole right side is shrinking' totally weak in right side"  . Gait Problem  . Visual Field Change    HISTORY OF PRESENT ILLNESS:   56 year old right-handed male here for evaluation of right upper extremity weakness and numbness and muscle atrophy. November 2017 patient woke up one day, went to the gym and noticed significant right upper extremity weakness. No pain at that time. Patient thought he had a pinched nerve and went to chiropractor. Over the next 4 weeks people at the gym said that he was "walking funny" and leaning and limping towards the right side. Over the next 60 weeks she noticed significant atrophy in his right upper extremity, biceps, triceps, forearm muscles.  Patient notes some intermittent muscle twitching and cramps in his right upper extremity. No problems his left arm, bilateral lower extremities. No speech or swallowing problems. No vision changes. No prodromal illness or vaccinations. No prodromal accidents or traumas.    REVIEW OF SYSTEMS: Full 14 system review of systems performed and negative with exception of: Snoring joint pain.  ALLERGIES: Allergies  Allergen Reactions  . Other     States that some ointments and deodorant cause him to have a rash    HOME MEDICATIONS: Outpatient Medications Prior to Visit  Medication Sig Dispense Refill  . Omega-3 Fatty Acids (OMEGA 3 PO) Take 3 capsules by mouth 2 (two) times daily.    Marland Kitchen MILK THISTLE PO Take 1 tablet by mouth 2 (two) times daily.    Marland Kitchen testosterone cypionate (DEPOTESTOTERONE CYPIONATE) 200 MG/ML injection Inject 100 mg into the muscle every  7 (seven) days. tuesdays    . chlorproMAZINE (THORAZINE) 25 MG tablet Take 1-2 tablets (25-50 mg total) by mouth 3 (three) times daily as needed for hiccoughs. (Patient not taking: Reported on 08/24/2016) 20 tablet 0  . omeprazole (PRILOSEC) 20 MG capsule Take 1 capsule (20 mg total) by mouth daily. (Patient not taking: Reported on 08/24/2016) 14 capsule 0  . ranitidine (ZANTAC) 150 MG tablet Take 1 tablet (150 mg total) by mouth 2 (two) times daily. (Patient not taking: Reported on 08/24/2016) 60 tablet 0   No facility-administered medications prior to visit.     PAST MEDICAL HISTORY: Past Medical History:  Diagnosis Date  . Anginal pain (HCC)   . Anxiety    body shakes  . Chest pain   . Dysrhythmia    Palpations, when really stress  . GERD (gastroesophageal reflux disease)   . Headache(784.0)    04/05/14- bad headache this week  . HOH (hard of hearing)   . PONV (postoperative nausea and vomiting)    not last time  . Seizures (HCC)    hx of as a child no problems now     PAST SURGICAL HISTORY: Past Surgical History:  Procedure Laterality Date  . HERNIA REPAIR  09/01/2007   Left Inguinal hernia  . HERNIA REPAIR  07/06/2010   recurrent left inguinal hernia  . INGUINAL HERNIA REPAIR  10/20/2011   Procedure: LAPAROSCOPIC INGUINAL HERNIA;  Surgeon: Wilmon Arms. Corliss Skains, MD;  Location: [facility];  Service: General;  Laterality: Left;  recurrent  .  INGUINAL HERNIA REPAIR Right 04/19/2014   Procedure: RIGHT INGUINAL HERNIA REPAIR WITH MESH;  Surgeon: Manus Rudd, MD;  Location: [facility];  Service: General;  Laterality: Right;  . INSERTION OF MESH Right 04/19/2014   Procedure: INSERTION OF MESH;  Surgeon: Manus Rudd, MD;  Location: [facility];  Service: General;  Laterality: Right;  . VENTRAL HERNIA REPAIR  10/20/2011   Procedure: LAPAROSCOPIC VENTRAL HERNIA;  Surgeon: Wilmon Arms. Corliss Skains, MD;  Location: [facility];  Service: General;  Laterality: N/A;    FAMILY HISTORY: Family History  Problem Relation  Age of Onset  . Cancer Father     lung  . Heart disease Father     SOCIAL HISTORY:  Social History   Social History  . Marital status: Single    Spouse name: N/A  . Number of children: 3  . Years of education: 7   Occupational History  .      [employer]   Social History Main Topics  . Smoking status: Former Smoker    Quit date: 07/26/1978  . Smokeless tobacco: Never Used  . Alcohol use No  . Drug use: No  . Sexual activity: Not on file   Other Topics Concern  . Not on file   Social History Narrative   Lives alone   Caffeine- coffee, [employer] espresso     PHYSICAL EXAM  GENERAL EXAM/CONSTITUTIONAL: Vitals:  Vitals:   10/11/16 0758  BP: (!) 149/74  Pulse: 77  Weight: 225 lb 9.6 oz (102.3 kg)  Height: 5\' 11"  (1.803 m)     Body mass index is 31.46 kg/m.  Visual Acuity Screening   Right eye Left eye Both eyes  Without correction: 20/20 20/20   With correction:        Patient is in no distress; well developed, nourished and groomed; neck is supple  CARDIOVASCULAR:  Examination of carotid arteries is normal; no carotid bruits  Regular rate and rhythm, no murmurs  Examination of peripheral vascular system by observation and palpation is normal  RADIAL PULSES SYMM  EYES:  Ophthalmoscopic exam of optic discs and posterior segments is normal; no papilledema or hemorrhages  MUSCULOSKELETAL:  Gait, strength, tone, movements noted in Neurologic exam below  NEUROLOGIC: MENTAL STATUS:  No flowsheet data found.  awake, alert, oriented to person, place and time  recent and remote memory intact  normal attention and concentration  language fluent, comprehension intact, naming intact,   fund of knowledge appropriate  CRANIAL NERVE:   2nd - no papilledema on fundoscopic exam  2nd, 3rd, 4th, 6th - pupils equal and reactive to light, visual fields full to confrontation, extraocular muscles intact, no nystagmus  5th - facial  sensation symmetric  7th - facial strength symmetric  8th - hearing intact  9th - palate elevates symmetrically, uvula midline  11th - shoulder shrug symmetric  12th - tongue protrusion midline  MOTOR:   ATROPHY IN RIGHT ARM (BICEPS, TRICEPS, FOREARM FLEXORS)  NO FASCICULATIONS  WEAKNESS IN RIGHT ARM (TRICEPS AND WRIST FLEXORS 4)  WEAKNESS IN RIGHT HIP FLEXOR (4)  OTHERWISE normal bulk and tone, full strength in the LUE, LLE  SENSORY:   normal and symmetric to light touch, pinprick, temperature, vibration  EXCEPT SLIGHT DECR PP IN RIGHT HAND  COORDINATION:   finger-nose-finger, fine finger movements normal  REFLEXES:   deep tendon reflexes --> ABSENT THROUGHTOUT  GAIT/STATION:   narrow based gait; able to walk on toes, heels and tandem; romberg is negative  RIGHT SHOULDER SLIGHTLY LOWER THAN LEFT    DIAGNOSTIC DATA (LABS, IMAGING, TESTING) - I reviewed patient records, labs, notes, testing and imaging myself where available.  Lab Results  Component Value Date   WBC 7.4 08/24/2016   HGB 18.4 (H) 08/24/2016   HCT 54.0 (H) 08/24/2016   MCV 93.5 08/24/2016   PLT 278 08/24/2016      Component Value Date/Time   NA 140 08/24/2016 2010   K 4.2 08/24/2016 2010   CL 103 08/24/2016 2010   CO2 24 08/24/2016 1953   GLUCOSE 90 08/24/2016 2010   BUN 15 08/24/2016 2010   CREATININE 1.30 (H) 08/24/2016 2010   CALCIUM 9.0 08/24/2016 1953   PROT 6.7 08/24/2016 1953   ALBUMIN 3.7 08/24/2016 1953   AST 45 (H) 08/24/2016 1953   ALT 59 08/24/2016 1953   ALKPHOS 42 08/24/2016 1953   BILITOT 0.9 08/24/2016 1953   GFRNONAA >60 08/24/2016 1953   GFRAA >60 08/24/2016 1953   No results found for: CHOL, HDL, LDLCALC, LDLDIRECT, TRIG, CHOLHDL No results found for: RUEA5WHGBA1C No results found for: VITAMINB12 No results found for: TSH   08/24/16 CT head [I reviewed images myself and agree with interpretation. -VRP]  - negative     ASSESSMENT AND PLAN  56 y.o. year  old male here with sudden onset right arm weakness, followed by right arm atrophy. Also with right hip flexor weakness and slight decreased pinprick sensation of the right hand. The sudden onset of symptoms raises possibility of central nervous system problem such as stroke. However CT scan of the head on 08/24/2016 was negative. We'll check MRI cervical spine and EMG study to start. Also will check peripheral neuropathy labs. If these are negative we may consider checking MRI of the brain.   Ddx: monomelic amyotrophy, cervical radiculopathy, brachial plexopathy, neuropathy, stroke   1. Atrophy of muscle of right upper arm   2. Right arm weakness   3. Right leg weakness      PLAN: - check MRI cervical spine and EMG/NCS - check labs - if negative, then will check MRI brain  Orders Placed This Encounter  Procedures  . MR CERVICAL SPINE WO CONTRAST  . Vitamin B12  . Hemoglobin A1c  . TSH  . Aldolase  . CK  . NCV with EMG(electromyography)   Return for for NCV/EMG.  I reviewed images, labs, notes, records myself. I summarized findings and reviewed with patient, for this high risk condition (sudden right arm weakness, right hand tingling) requiring high complexity decision making.     Suanne MarkerVIKRAM R. Heidy Mccubbin, MD 10/11/2016, 8:20 AM Certified in Neurology, Neurophysiology and Neuroimaging  [facility] [address], [address] [place], [place] [id number] [phone]

## 2016-10-11 NOTE — Patient Instructions (Signed)
-   I will check MRI cervical spine and electrical nerve test (EMG)  - I will check lab testing

## 2016-10-12 LAB — VITAMIN B12: Vitamin B-12: 670 pg/mL (ref 232–1245)

## 2016-10-12 LAB — TSH: TSH: 2.46 u[IU]/mL (ref 0.450–4.500)

## 2016-10-12 LAB — ALDOLASE: Aldolase: 5.6 U/L (ref 3.3–10.3)

## 2016-10-12 LAB — HEMOGLOBIN A1C
Est. average glucose Bld gHb Est-mCnc: 126 mg/dL
Hgb A1c MFr Bld: 6 % — ABNORMAL HIGH (ref 4.8–5.6)

## 2016-10-12 LAB — CK: Total CK: 376 U/L — ABNORMAL HIGH (ref 24–204)

## 2016-10-14 ENCOUNTER — Telehealth: Payer: Self-pay | Admitting: *Deleted

## 2016-10-15 NOTE — Telephone Encounter (Signed)
Per Dr Marjory LiesPenumalli, LVM informing patient his labs notable for borderline diabetes. Advised he FU with his PCP for that issue.  Advised him his muscle enzymes are high, indicating possible muscle disease, but there is no specific diagnosis. Reminded him of MRI cervical spine and EMG testing dates and advised Dr Marjory LiesPenumalli will gather further information from those tests. Left number and office hours today if he had questions.

## 2016-10-26 ENCOUNTER — Ambulatory Visit
Admission: RE | Admit: 2016-10-26 | Discharge: 2016-10-26 | Disposition: A | Discharge status: Home / Self Care | Payer: No Typology Code available for payment source | Source: Ambulatory Visit | Attending: Diagnostic Neuroimaging | Admitting: Diagnostic Neuroimaging

## 2016-10-26 DIAGNOSIS — R29898 Other symptoms and signs involving the musculoskeletal system: Secondary | ICD-10-CM

## 2016-10-26 DIAGNOSIS — M62521 Muscle wasting and atrophy, not elsewhere classified, right upper arm: Secondary | ICD-10-CM

## 2016-11-04 ENCOUNTER — Ambulatory Visit (INDEPENDENT_AMBULATORY_CARE_PROVIDER_SITE_OTHER): Payer: Self-pay | Admitting: Diagnostic Neuroimaging

## 2016-11-04 ENCOUNTER — Encounter (INDEPENDENT_AMBULATORY_CARE_PROVIDER_SITE_OTHER): Payer: Self-pay | Admitting: Diagnostic Neuroimaging

## 2016-11-04 DIAGNOSIS — M62521 Muscle wasting and atrophy, not elsewhere classified, right upper arm: Secondary | ICD-10-CM

## 2016-11-04 DIAGNOSIS — Z0289 Encounter for other administrative examinations: Secondary | ICD-10-CM

## 2016-11-04 DIAGNOSIS — R29898 Other symptoms and signs involving the musculoskeletal system: Secondary | ICD-10-CM

## 2016-11-05 NOTE — Procedures (Signed)
[facility]  NCS (NERVE CONDUCTION STUDY) WITH EMG (ELECTROMYOGRAPHY) REPORT   STUDY DATE: 11/04/16 PATIENT NAME: Joel Lawrence DOB: 07/20/1961 MRN: [id number]  ORDERING CLINICIAN: Joycelyn Schmid, MD   TECHNOLOGIST: Charlesetta Ivory ELECTROMYOGRAPHER: Glenford Bayley. Columbia Pandey, MD  CLINICAL INFORMATION: 56 year old male with right arm atrophy and weakness.  FINDINGS: NERVE CONDUCTION STUDY: Right median, right ulnar and right radial motor responses are normal. Left ulnar motor response is normal. Bilateral ulnar F wave latencies are normal.  Right radial, right median and right ulnar sensory responses are normal.   NEEDLE ELECTROMYOGRAPHY: Needle examination of right upper extremity demonstrates 2+ positive sharp waves and 1+ fibrillation potentials in the right triceps muscle at rest and decreased recruitment of large size motor units on exertion.  Right deltoid, right biceps, right flexor carpi radialis, right brachioradialis --> normal.  Right first dorsal interosseous and right extensor indicis proprius --> no abnormal spontaneous activity at rest and decreased recruitment of large motor units on exertion.  Right cervical paraspinal muscles (C7-T1) demonstrate 1+ fibrillation potentials.   IMPRESSION:   Abnormal study demonstrating: - Normal nerve conduction study. - Active denervation in right C7-T1 paraspinal muscles. Active and chronic denervation in right triceps muscle. Chronic denervation in right first dorsal interosseous and right extensor indicis proprius muscles. 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    INTERPRETING PHYSICIAN:  Suanne Marker, MD Certified in Neurology, Neurophysiology and Neuroimaging  [facility] [address], [address] [place], [place] [address] [phone]   [facility]    Nerve / Sites Muscle Latency Ref. Amplitude Ref. Rel Amp Segments Distance Velocity Ref. Area    ms ms mV mV  %  cm m/s m/s mVms  R Median - APB     Wrist APB 3.0 ?4.4 7.4 ?4.0 100 Wrist - APB 7   30.5     Upper arm APB 7.1  7.1  96 Upper arm - Wrist 22 54 ?49 29.6  R Ulnar - ADM     Wrist ADM 2.7 ?3.3 9.0 ?6.0 100 Wrist - ADM 7   31.3     B.Elbow ADM 6.3  8.2  91.8 B.Elbow - Wrist 20 56 ?49 30.7     A.Elbow ADM 8.1  7.8  95 A.Elbow - B.Elbow 10 53 ?49 30.4     Axilla ADM 10.8  7.7  97.9 Axilla - A.Elbow 18 68  31.3     Supraclav fossa ADM 13.4  7.6  99.4 Supraclav fossa - Axilla    30.3         A.Elbow - Wrist      L Ulnar - ADM     Wrist ADM 2.4 ?3.3 10.3 ?6.0 100 Wrist - ADM 7   33.7     B.Elbow ADM 5.7  9.3  90.2 B.Elbow - Wrist 19 57 ?49 33.3     A.Elbow ADM 7.7  9.0  97.2 A.Elbow - B.Elbow 10 52 ?49 33.3     Axilla ADM 11.6  7.8  86.8 Axilla - A.Elbow 20 51  28.8     Supraclav fossa ADM 14.5  5.1  65.4 Supraclav fossa - Axilla 18 62  19.1         A.Elbow - Wrist      R Radial - EIP     Forearm EIP 1.6 ?2.9 7.6 ?2.0 100 Forearm - EIP 4  ?49 34.9     Elbow EIP 4.8  7.9  105 Elbow -  Forearm 15 46  35.9     Spiral Gr EIP 6.5  7.5  95 Spiral Gr - Elbow 9 54  33.3              SNC    Nerve / Sites Rec. Site Peak Lat Ref.  Amp Ref. Segments Distance    ms ms V V  cm  R Radial - Anatomical snuff box (Forearm)     Forearm Wrist 2.4 ?2.9 19 ?15 Forearm - Wrist 10  R Median - Orthodromic (Dig II, Mid palm)     Dig II Wrist 2.9 ?3.4 23 ?10 Dig II - Wrist 13  R Ulnar - Orthodromic, (Dig V, Mid palm)     Dig V Wrist 2.6 ?3.1 9 ?5 Dig V - Wrist 51           F  Wave    Nerve F Lat Ref.   ms ms  R Ulnar - ADM 29.0 ?32.0  L Ulnar - ADM 29.6 ?32.0         EMG full       EMG Summary Table    Spontaneous MUAP Recruitment  Muscle IA Fib PSW Fasc Other Amp Dur. Poly Pattern  R. Deltoid Normal None None None _______ Normal Normal Normal Normal  R. Biceps brachii Normal None None None _______ Normal Normal Normal Normal  R. Triceps brachii Normal 1+ 2+ None _______ Increased Normal Normal  Reduced  R. Flexor carpi radialis Normal None None None _______ Normal Normal Normal Normal  R. Brachioradialis Normal None None None _______ Normal Normal Normal Normal  R. First dorsal interosseous Normal None None None _______ Increased Normal Normal Reduced  R. Extensor indicis proprius Normal None None None _______ Increased Normal Normal Reduced  R. Cervical paraspinals (C7-T1) Normal 1+ None None _______ Normal Normal Normal Normal

## 2017-10-25 ENCOUNTER — Other Ambulatory Visit: Payer: Self-pay | Admitting: Orthopedic Surgery

## 2017-10-25 DIAGNOSIS — M67911 Unspecified disorder of synovium and tendon, right shoulder: Secondary | ICD-10-CM

## 2017-10-30 ENCOUNTER — Ambulatory Visit
Admission: RE | Admit: 2017-10-30 | Discharge: 2017-10-30 | Disposition: A | Discharge status: Home / Self Care | Payer: BLUE CROSS/BLUE SHIELD | Source: Ambulatory Visit | Attending: Orthopedic Surgery | Admitting: Orthopedic Surgery

## 2017-10-30 DIAGNOSIS — M67911 Unspecified disorder of synovium and tendon, right shoulder: Secondary | ICD-10-CM

## 2017-12-21 ENCOUNTER — Other Ambulatory Visit: Payer: Self-pay | Admitting: Orthopedic Surgery

## 2017-12-30 ENCOUNTER — Encounter (HOSPITAL_BASED_OUTPATIENT_CLINIC_OR_DEPARTMENT_OTHER): Payer: Self-pay | Admitting: *Deleted

## 2017-12-30 ENCOUNTER — Other Ambulatory Visit: Payer: Self-pay

## 2018-01-05 NOTE — H&P (Signed)
Joel CarmineWade A Lawrence is an [age] y.o. male.   CC / Reason for Visit: Right upper extremity weakness and pain HPI: This patient returns to clinic today after having undergone an MRI evaluation of his right shoulder.  It indicated that he has a full-thickness supraspinatus tendon tear with retraction as well as tendinopathy of the biceps long head.    HPI 10/20/2017: This patient returns reevaluation, having undergone a right TMC injection on 09-22-17.  It has helped his thumb pain greatly.  However he continues to have primarily a sharp anterior shoulder pain when any time in a push-up position.  He also feels a sharp pain in the posterior triceps and sometimes radiating distal from the shoulder towards the hand.  HPI 09-14-17: Patient is a 57 year old, right-hand-dominant, male who indicates that in November 2017 he had an episode in which he lost strength in his right side.  He indicates that he was seen by the emergency department who did a CT scan for fear that he had a stroke.  He then indicates that he saw a neurologist where he had further workup including EMGs as well as MRI, but these were not available for review.  He is here today for anterior shoulder pain, right TMC thumb pain as well as some elbow pain  Past Medical History:  Diagnosis Date  . Anginal pain (HCC)   . Anxiety    body shakes  . Chest pain   . Dysrhythmia    Palpations, when really stress  . GERD (gastroesophageal reflux disease)   . Headache(784.0)    04/05/14- bad headache this week  . HOH (hard of hearing)   . PONV (postoperative nausea and vomiting)    not last time  . Seizures (HCC)    hx of as a child no problems now     Past Surgical History:  Procedure Laterality Date  . HERNIA REPAIR  09/01/2007   Left Inguinal hernia  . HERNIA REPAIR  07/06/2010   recurrent left inguinal hernia  . INGUINAL HERNIA REPAIR  10/20/2011   Procedure: LAPAROSCOPIC INGUINAL HERNIA;  Surgeon: Wilmon ArmsMatthew K. Corliss Skainssuei, MD;  Location: [facility];   Service: General;  Laterality: Left;  recurrent  . INGUINAL HERNIA REPAIR Right 04/19/2014   Procedure: RIGHT INGUINAL HERNIA REPAIR WITH MESH;  Surgeon: Manus RuddMatthew Tsuei, MD;  Location: [facility];  Service: General;  Laterality: Right;  . INSERTION OF MESH Right 04/19/2014   Procedure: INSERTION OF MESH;  Surgeon: Manus RuddMatthew Tsuei, MD;  Location: [facility];  Service: General;  Laterality: Right;  . VENTRAL HERNIA REPAIR  10/20/2011   Procedure: LAPAROSCOPIC VENTRAL HERNIA;  Surgeon: Wilmon ArmsMatthew K. Corliss Skainssuei, MD;  Location: [facility];  Service: General;  Laterality: N/A;    Family History  Problem Relation Age of Onset  . Cancer Father        lung  . Heart disease Father    Social History:  reports that he quit smoking about 39 years ago. He has never used smokeless tobacco. He reports that he does not drink alcohol or use drugs.  Allergies:  Allergies  Allergen Reactions  . Other     States that some ointments and deodorant cause him to have a rash    No medications prior to admission.    No results found for this or any previous visit (from the past 48 hour(s)). No results found.  Review of Systems  All other systems reviewed and are negative.   Height 5\' 11"  (1.803 m), weight 108  kg (238 lb). Physical Exam  Constitutional:  WD, WN, NAD HEENT:  NCAT, EOMI Neuro/Psych:  Alert & oriented to person, place, and time; appropriate mood & affect Lymphatic: No generalized UE edema or lymphadenopathy Extremities / MSK:  Both UE are normal with respect to appearance, ranges of motion, joint stability, muscle strength/tone, sensation, & perfusion except as otherwise noted:  Evaluation is much the same as it has been including anterior shoulder pain and tenderness with palpation.  Good range of motion but discomfort with O'Brien's maneuver.  Pain with abduction and scapular retraction.  NVI.  Labs / Xrays:  No radiographic studies obtained today.  Assessment: 1.  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  Right anterior shoulder pain, right proximal biceps tendinitis 3.  Probable right lateral epicondylitis 4.  Rotator cuff tear  Plan:  We discussed today's findings.  The patient wishes to think about arthroscopic surgery for rotator cuff tear, impingement, and biceps tendinitis.  A green sheet has been completed, and he will contact, our surgery coordinator if in fact he wishes to move forward with arthroscopic rotator cuff repair.  The details of the operative procedure were discussed with the patient.  Questions were invited and answered.  In addition to the goal of the procedure, the risks of the procedure to include but not limited to bleeding; infection; damage to the nerves or blood vessels that could result in bleeding, numbness, weakness, chronic pain, and the need for additional procedures; stiffness; the need for revision surgery; and anesthetic risks were reviewed.  No specific outcome was guaranteed or implied.   Jodi Marble, MD 01/05/2018, 10:45 AM

## 2018-01-09 ENCOUNTER — Ambulatory Visit (HOSPITAL_BASED_OUTPATIENT_CLINIC_OR_DEPARTMENT_OTHER)
Admission: RE | Admit: 2018-01-09 | Discharge: 2018-01-09 | Disposition: A | Discharge status: Home / Self Care | Payer: BLUE CROSS/BLUE SHIELD | Source: Ambulatory Visit | Attending: Orthopedic Surgery | Admitting: Orthopedic Surgery

## 2018-01-09 ENCOUNTER — Ambulatory Visit (HOSPITAL_BASED_OUTPATIENT_CLINIC_OR_DEPARTMENT_OTHER): Payer: BLUE CROSS/BLUE SHIELD | Admitting: Certified Registered"

## 2018-01-09 ENCOUNTER — Encounter (HOSPITAL_BASED_OUTPATIENT_CLINIC_OR_DEPARTMENT_OTHER)
Admission: RE | Disposition: A | Discharge status: Home / Self Care | Payer: Self-pay | Source: Ambulatory Visit | Attending: Orthopedic Surgery

## 2018-01-09 ENCOUNTER — Encounter (HOSPITAL_BASED_OUTPATIENT_CLINIC_OR_DEPARTMENT_OTHER): Payer: Self-pay | Admitting: *Deleted

## 2018-01-09 ENCOUNTER — Ambulatory Visit (HOSPITAL_COMMUNITY): Payer: BLUE CROSS/BLUE SHIELD

## 2018-01-09 ENCOUNTER — Other Ambulatory Visit: Payer: Self-pay

## 2018-01-09 DIAGNOSIS — M7521 Bicipital tendinitis, right shoulder: Secondary | ICD-10-CM | POA: Insufficient documentation

## 2018-01-09 DIAGNOSIS — R0602 Shortness of breath: Secondary | ICD-10-CM

## 2018-01-09 DIAGNOSIS — Z87891 Personal history of nicotine dependence: Secondary | ICD-10-CM | POA: Insufficient documentation

## 2018-01-09 DIAGNOSIS — S46011A Strain of muscle(s) and tendon(s) of the rotator cuff of right shoulder, initial encounter: Secondary | ICD-10-CM | POA: Diagnosis present

## 2018-01-09 DIAGNOSIS — X58XXXA Exposure to other specified factors, initial encounter: Secondary | ICD-10-CM | POA: Insufficient documentation

## 2018-01-09 DIAGNOSIS — M7541 Impingement syndrome of right shoulder: Secondary | ICD-10-CM | POA: Diagnosis not present

## 2018-01-09 HISTORY — PX: SHOULDER ARTHROSCOPY WITH ROTATOR CUFF REPAIR: SHX5685

## 2018-01-09 HISTORY — PX: SHOULDER ARTHROSCOPY WITH BICEPSTENOTOMY: SHX6204

## 2018-01-09 SURGERY — ARTHROSCOPY, SHOULDER, WITH ROTATOR CUFF REPAIR
Anesthesia: General | Site: Shoulder | Laterality: Right

## 2018-01-09 MED ORDER — OXYCODONE HCL 5 MG PO TABS: 5.0000 mg | ORAL_TABLET | Freq: Four times a day (QID) | ORAL | 0 refills | Status: DC | PRN

## 2018-01-09 MED ORDER — FENTANYL CITRATE (PF) 100 MCG/2ML IJ SOLN
INTRAMUSCULAR | Status: AC
  Filled 2018-01-09: qty 2

## 2018-01-09 MED ORDER — MIDAZOLAM HCL 2 MG/2ML IJ SOLN
1.0000 mg | INTRAMUSCULAR | Status: DC | PRN
  Administered 2018-01-09: 2 mg via INTRAVENOUS

## 2018-01-09 MED ORDER — DEXAMETHASONE SODIUM PHOSPHATE 10 MG/ML IJ SOLN
INTRAMUSCULAR | Status: AC
  Filled 2018-01-09: qty 1

## 2018-01-09 MED ORDER — SCOPOLAMINE 1 MG/3DAYS TD PT72
MEDICATED_PATCH | TRANSDERMAL | Status: AC
  Filled 2018-01-09: qty 1

## 2018-01-09 MED ORDER — ACETAMINOPHEN 325 MG PO TABS: 650.0000 mg | ORAL_TABLET | Freq: Four times a day (QID) | ORAL | Status: DC

## 2018-01-09 MED ORDER — ONDANSETRON HCL 4 MG/2ML IJ SOLN
INTRAMUSCULAR | Status: DC | PRN
  Administered 2018-01-09: 4 mg via INTRAVENOUS

## 2018-01-09 MED ORDER — PROMETHAZINE HCL 25 MG/ML IJ SOLN
INTRAMUSCULAR | Status: AC
  Filled 2018-01-09: qty 1

## 2018-01-09 MED ORDER — DEXAMETHASONE SODIUM PHOSPHATE 10 MG/ML IJ SOLN
INTRAMUSCULAR | Status: DC | PRN
  Administered 2018-01-09: 10 mg via INTRAVENOUS

## 2018-01-09 MED ORDER — LACTATED RINGERS IV SOLN: INTRAVENOUS | Status: DC

## 2018-01-09 MED ORDER — PROMETHAZINE HCL 25 MG/ML IJ SOLN
12.5000 mg | Freq: Once | INTRAMUSCULAR | Status: AC
  Administered 2018-01-09: 6.25 mg via INTRAVENOUS

## 2018-01-09 MED ORDER — SCOPOLAMINE 1 MG/3DAYS TD PT72
1.0000 | MEDICATED_PATCH | Freq: Once | TRANSDERMAL | Status: DC | PRN
  Administered 2018-01-09: 1.5 mg via TRANSDERMAL

## 2018-01-09 MED ORDER — FENTANYL CITRATE (PF) 250 MCG/5ML IJ SOLN
INTRAMUSCULAR | Status: DC | PRN
  Administered 2018-01-09 (×2): 50 ug via INTRAVENOUS

## 2018-01-09 MED ORDER — ONDANSETRON HCL 4 MG/2ML IJ SOLN
INTRAMUSCULAR | Status: AC
  Filled 2018-01-09: qty 2

## 2018-01-09 MED ORDER — MIDAZOLAM HCL 2 MG/2ML IJ SOLN
INTRAMUSCULAR | Status: AC
  Filled 2018-01-09: qty 2

## 2018-01-09 MED ORDER — SODIUM CHLORIDE 0.9 % IR SOLN
Status: DC | PRN
  Administered 2018-01-09: 18000 mL

## 2018-01-09 MED ORDER — PROPOFOL 10 MG/ML IV BOLUS
INTRAVENOUS | Status: AC
  Filled 2018-01-09: qty 20

## 2018-01-09 MED ORDER — FENTANYL CITRATE (PF) 100 MCG/2ML IJ SOLN
50.0000 ug | INTRAMUSCULAR | Status: DC | PRN
  Administered 2018-01-09: 50 ug via INTRAVENOUS

## 2018-01-09 MED ORDER — SUGAMMADEX SODIUM 200 MG/2ML IV SOLN
INTRAVENOUS | Status: DC | PRN
  Administered 2018-01-09: 200 mg via INTRAVENOUS

## 2018-01-09 MED ORDER — PROPOFOL 10 MG/ML IV BOLUS
INTRAVENOUS | Status: DC | PRN
  Administered 2018-01-09: 180 mg via INTRAVENOUS

## 2018-01-09 MED ORDER — LIDOCAINE HCL (CARDIAC) PF 100 MG/5ML IV SOSY
PREFILLED_SYRINGE | INTRAVENOUS | Status: AC
  Filled 2018-01-09: qty 5

## 2018-01-09 MED ORDER — LIDOCAINE 2% (20 MG/ML) 5 ML SYRINGE
INTRAMUSCULAR | Status: DC | PRN
  Administered 2018-01-09: 20 mg via INTRAVENOUS

## 2018-01-09 MED ORDER — BUPIVACAINE-EPINEPHRINE (PF) 0.5% -1:200000 IJ SOLN
INTRAMUSCULAR | Status: DC | PRN
  Administered 2018-01-09: 25 mL via PERINEURAL

## 2018-01-09 MED ORDER — CEFAZOLIN SODIUM-DEXTROSE 2-4 GM/100ML-% IV SOLN
INTRAVENOUS | Status: AC
  Filled 2018-01-09: qty 100

## 2018-01-09 MED ORDER — ONDANSETRON HCL 4 MG/2ML IJ SOLN
4.0000 mg | Freq: Once | INTRAMUSCULAR | Status: AC | PRN
  Administered 2018-01-09: 4 mg via INTRAVENOUS

## 2018-01-09 MED ORDER — CLONIDINE HCL (ANALGESIA) 100 MCG/ML EP SOLN
EPIDURAL | Status: DC | PRN
  Administered 2018-01-09: 50 ug

## 2018-01-09 MED ORDER — CEFAZOLIN SODIUM-DEXTROSE 2-4 GM/100ML-% IV SOLN
2.0000 g | INTRAVENOUS | Status: AC
  Administered 2018-01-09: 2 g via INTRAVENOUS

## 2018-01-09 MED ORDER — EPINEPHRINE 30 MG/30ML IJ SOLN
INTRAMUSCULAR | Status: AC
  Filled 2018-01-09: qty 1

## 2018-01-09 MED ORDER — LACTATED RINGERS IV SOLN
INTRAVENOUS | Status: DC
  Administered 2018-01-09 (×2): via INTRAVENOUS

## 2018-01-09 MED ORDER — ROCURONIUM BROMIDE 10 MG/ML (PF) SYRINGE
PREFILLED_SYRINGE | INTRAVENOUS | Status: DC | PRN
Start: 2018-01-09 — End: 2018-01-09
  Administered 2018-01-09: 50 mg via INTRAVENOUS

## 2018-01-09 MED ORDER — FENTANYL CITRATE (PF) 100 MCG/2ML IJ SOLN: 25.0000 ug | INTRAMUSCULAR | Status: DC | PRN

## 2018-01-09 MED ORDER — ROCURONIUM BROMIDE 10 MG/ML (PF) SYRINGE
PREFILLED_SYRINGE | INTRAVENOUS | Status: AC
  Filled 2018-01-09: qty 10

## 2018-01-09 MED ORDER — SUGAMMADEX SODIUM 200 MG/2ML IV SOLN
INTRAVENOUS | Status: AC
  Filled 2018-01-09: qty 2

## 2018-01-09 SURGICAL SUPPLY — 87 items
BENZOIN TINCTURE PRP APPL 2/3 (GAUZE/BANDAGES/DRESSINGS) IMPLANT
BLADE AVERAGE 25MMX9MM (BLADE)
BLADE AVERAGE 25X9 (BLADE) IMPLANT
BLADE CUTTER GATOR 3.5 (BLADE) IMPLANT
BLADE GREAT WHITE 4.2 (BLADE) ×2 IMPLANT
BLADE GREAT WHITE 4.2MM (BLADE) ×1
BLADE SURG 15 STRL LF DISP TIS (BLADE) ×1 IMPLANT
BLADE SURG 15 STRL SS (BLADE) ×2
BUR OVAL 4.0 (BURR) ×3 IMPLANT
BUR OVAL 6.0 (BURR) IMPLANT
CANNULA 5.75X71 LONG (CANNULA) IMPLANT
CANNULA TWIST IN 8.25X7CM (CANNULA) ×3 IMPLANT
CLEANER CAUTERY TIP 5X5 PAD (MISCELLANEOUS) IMPLANT
CLOSURE WOUND 1/2 X4 (GAUZE/BANDAGES/DRESSINGS)
DECANTER SPIKE VIAL GLASS SM (MISCELLANEOUS) IMPLANT
DERMABOND ADVANCED (GAUZE/BANDAGES/DRESSINGS)
DERMABOND ADVANCED .7 DNX12 (GAUZE/BANDAGES/DRESSINGS) IMPLANT
DRAPE IMP U-DRAPE 54X76 (DRAPES) ×3 IMPLANT
DRAPE STERI 35X30 U-POUCH (DRAPES) ×3 IMPLANT
DRAPE SURG 17X23 STRL (DRAPES) ×3 IMPLANT
DRAPE U-SHAPE 47X51 STRL (DRAPES) ×3 IMPLANT
DRAPE U-SHAPE 76X120 STRL (DRAPES) ×6 IMPLANT
DRSG ADAPTIC 3X8 NADH LF (GAUZE/BANDAGES/DRESSINGS) ×3 IMPLANT
DURAPREP 26ML APPLICATOR (WOUND CARE) ×3 IMPLANT
ELECT REM PT RETURN 9FT ADLT (ELECTROSURGICAL) ×3
ELECTRODE REM PT RTRN 9FT ADLT (ELECTROSURGICAL) ×1 IMPLANT
GAUZE SPONGE 4X4 12PLY STRL (GAUZE/BANDAGES/DRESSINGS) ×3 IMPLANT
GAUZE SPONGE 4X4 12PLY STRL LF (GAUZE/BANDAGES/DRESSINGS) ×3 IMPLANT
GLOVE BIO SURGEON STRL SZ7.5 (GLOVE) ×3 IMPLANT
GLOVE BIO SURGEON STRL SZ8 (GLOVE) ×3 IMPLANT
GLOVE BIOGEL PI IND STRL 7.0 (GLOVE) ×1 IMPLANT
GLOVE BIOGEL PI IND STRL 8 (GLOVE) ×1 IMPLANT
GLOVE BIOGEL PI INDICATOR 7.0 (GLOVE) ×2
GLOVE BIOGEL PI INDICATOR 8 (GLOVE) ×2
GLOVE ECLIPSE 6.5 STRL STRAW (GLOVE) ×6 IMPLANT
GOWN STRL REUS W/ TWL LRG LVL3 (GOWN DISPOSABLE) ×1 IMPLANT
GOWN STRL REUS W/TWL LRG LVL3 (GOWN DISPOSABLE) ×2
GOWN STRL REUS W/TWL XL LVL3 (GOWN DISPOSABLE) ×6 IMPLANT
IMPL SPEEDBRIDGE KIT (Orthopedic Implant) ×1 IMPLANT
IMPLANT SPEEDBRIDGE KIT (Orthopedic Implant) ×3 IMPLANT
IV NS IRRIG 3000ML ARTHROMATIC (IV SOLUTION) ×21 IMPLANT
MANIFOLD NEPTUNE II (INSTRUMENTS) ×3 IMPLANT
NDL SUT 6 .5 CRC .975X.05 MAYO (NEEDLE) IMPLANT
NEEDLE MAYO TAPER (NEEDLE)
NEEDLE SCORPION MULTI FIRE (NEEDLE) IMPLANT
PACK ARTHROSCOPY DSU (CUSTOM PROCEDURE TRAY) ×3 IMPLANT
PACK BASIN DAY SURGERY FS (CUSTOM PROCEDURE TRAY) ×3 IMPLANT
PAD CLEANER CAUTERY TIP 5X5 (MISCELLANEOUS)
PAD ORTHO SHOULDER 7X19 LRG (SOFTGOODS) ×3 IMPLANT
PENCIL BUTTON HOLSTER BLD 10FT (ELECTRODE) IMPLANT
PORT APPOLLO RF 90DEGREE MULTI (SURGICAL WAND) IMPLANT
PROBE BIPOLAR ATHRO 135MM 90D (MISCELLANEOUS) ×3 IMPLANT
RESTRAINT HEAD UNIVERSAL NS (MISCELLANEOUS) ×3 IMPLANT
RETRIEVER SUT HEWSON (MISCELLANEOUS) IMPLANT
SLEEVE SCD COMPRESS KNEE MED (MISCELLANEOUS) ×3 IMPLANT
SLING ARM FOAM STRAP LRG (SOFTGOODS) IMPLANT
SLING ARM MED ADULT FOAM STRAP (SOFTGOODS) IMPLANT
SLING ARM SM FOAM STRAP (SOFTGOODS) IMPLANT
SLING ARM XL FOAM STRAP (SOFTGOODS) IMPLANT
SLING ULTRA III MED (ORTHOPEDIC SUPPLIES) IMPLANT
SPONGE LAP 4X18 RFD (DISPOSABLE) IMPLANT
STAPLER VISISTAT 35W (STAPLE) IMPLANT
STRIP CLOSURE SKIN 1/2X4 (GAUZE/BANDAGES/DRESSINGS) IMPLANT
SUCTION FRAZIER HANDLE 10FR (MISCELLANEOUS)
SUCTION TUBE FRAZIER 10FR DISP (MISCELLANEOUS) IMPLANT
SUPPORT WRAP ARM LG (MISCELLANEOUS) ×3 IMPLANT
SUT ETHIBOND 2 OS 4 DA (SUTURE) IMPLANT
SUT ETHILON 3 0 PS 1 (SUTURE) IMPLANT
SUT FIBERWIRE #2 38 T-5 BLUE (SUTURE)
SUT TIGER TAPE 7 IN WHITE (SUTURE) ×3 IMPLANT
SUT VIC AB 0 SH 27 (SUTURE) IMPLANT
SUT VIC AB 2-0 CT3 27 (SUTURE) IMPLANT
SUT VIC AB 2-0 SH 27 (SUTURE)
SUT VIC AB 2-0 SH 27XBRD (SUTURE) IMPLANT
SUT VICRYL 3-0 CR8 SH (SUTURE) IMPLANT
SUT VICRYL 4-0 PS2 18IN ABS (SUTURE) IMPLANT
SUT VICRYL RAPIDE 4-0 (SUTURE) IMPLANT
SUT VICRYL RAPIDE 4/0 PS 2 (SUTURE) IMPLANT
SUTURE FIBERWR #2 38 T-5 BLUE (SUTURE) IMPLANT
SYR BULB 3OZ (MISCELLANEOUS) IMPLANT
TAPE FIBER 2MM 7IN #2 BLUE (SUTURE) IMPLANT
TOWEL GREEN STERILE FF (TOWEL DISPOSABLE) ×3 IMPLANT
TOWEL OR NON WOVEN STRL DISP B (DISPOSABLE) IMPLANT
TUBE CONNECTING 20'X1/4 (TUBING) ×1
TUBE CONNECTING 20X1/4 (TUBING) ×2 IMPLANT
TUBING ARTHRO INFLOW-ONLY STRL (TUBING) ×3 IMPLANT
YANKAUER SUCT BULB TIP NO VENT (SUCTIONS) IMPLANT

## 2018-01-09 NOTE — Transfer of Care (Signed)
Immediate Anesthesia Transfer of Care Note  Patient: Joel Lawrence  Procedure(s) Performed: SHOULDER ARTHROSCOPY WITH ROTATOR CUFF REPAIR,SUBACROMIAL DECOMPRESSION, BICEPS TENOTOMY (Right Shoulder) SHOULDER ARTHROSCOPY WITH BICEPSTENOTOMY (Right Shoulder)  Patient Location: PACU  Anesthesia Type:General  Level of Consciousness: sedated  Airway & Oxygen Therapy: Patient Spontanous Breathing and Patient connected to face mask oxygen  Post-op Assessment: Report given to RN and Post -op Vital signs reviewed and stable  Post vital signs: Reviewed and stable  Last Vitals:  Vitals Value Taken Time  BP    Temp    Pulse    Resp    SpO2      Last Pain:  Vitals:   01/09/18 0817  TempSrc: Oral  PainSc: 0-No pain         Complications: No apparent anesthesia complications

## 2018-01-09 NOTE — Anesthesia Postprocedure Evaluation (Signed)
Anesthesia Post Note  Patient: Joel Lawrence  Procedure(s) Performed: SHOULDER ARTHROSCOPY WITH ROTATOR CUFF REPAIR,SUBACROMIAL DECOMPRESSION, BICEPS TENOTOMY (Right Shoulder) SHOULDER ARTHROSCOPY WITH BICEPSTENOTOMY (Right Shoulder)     Patient location during evaluation: PACU Anesthesia Type: General Level of consciousness: sedated and patient cooperative Pain management: pain level controlled Vital Signs Assessment: post-procedure vital signs reviewed and stable Respiratory status: spontaneous breathing Cardiovascular status: stable Anesthetic complications: no    Last Vitals:  Vitals:   01/09/18 1430 01/09/18 1445  BP:    Pulse: 72   Resp: 15   Temp:    SpO2: 95% 92%    Last Pain:  Vitals:   01/09/18 1445  TempSrc:   PainSc: 0-No pain    Some mild hypoxemia in PACU. Responds to IS and other recruitment maneuvers. CXR showedR hemi diaphragm elevation.                Lewie LoronJohn Zackarie Chason

## 2018-01-09 NOTE — Op Note (Signed)
01/09/2018  9:30 AM  PATIENT:  Joel Lawrence  57 y.o. male  PRE-OPERATIVE DIAGNOSIS:  Right shoulder RTC tear with impingement & biceps pathology  POST-OPERATIVE DIAGNOSIS:  Same  PROCEDURE:  1. Right shoulder arthroscopic RTC repair    2. Right shoulder athroscopic SAD/Aplasty    3. Right shoulder arthroscopic intra-articular debridement (labrum & biceps tenotomy)  SURGEON: Cliffton Asters. Janee Morn, MD  PHYSICIAN ASSISTANT: Alphonsus Sias, OPA-C  ANESTHESIA:  regional and general  SPECIMENS:  None  DRAINS:   None  EBL:  less than 50 mL  PREOPERATIVE INDICATIONS:  Joel Lawrence is a  57 y.o. male with right shoulder RTC tear, impingement, & biceps pathology who failed to improve sufficiently with non-op management.  The risks benefits and alternatives were discussed with the patient preoperatively including but not limited to the risks of infection, bleeding, nerve injury, cardiopulmonary complications, the need for revision surgery, among others, and the patient verbalized understanding and consented to proceed.  OPERATIVE IMPLANTS: Arthrex speedbridge (4.75 swivelock anchors x 4)  OPERATIVE PROCEDURE:  After receiving prophylactic antibiotics & a regional block, the patient was escorted to the operative theatre and placed in a supine position.  General anesthesia was administered.  A surgical "time-out" was performed during which the planned procedure, proposed operative site, and the correct patient identity were compared to the operative consent and agreement confirmed by the circulating nurse according to current facility policy.    He was repositioned in the beach chair positioning device, with care to pad the appropriate pressure points.  The right upper extremity was prepped with DuraPrep and draped in usual sterile fashion.  External landmarks were drawn and the joint was insufflated with the epinephrine containing solution.  Standard posterior portal was established first,  and arthroscopic viewing commenced.  Needle localization was used to place the anterior portal and a clear large cannula was placed anteriorly.  Arthroscopic evaluation commenced, noting fraying and degenerative labral disease affecting the superior labrum extending down the upper portion of the anterior labrum.  The biceps insertion was partially detached, leaving about 50% of the biceps root attached to the labrum.  Biceps tenotomy was performed, and the tendon slipped out of view anteriorly.  Suction shaver and ArthroCare wand were used to further debride the labrum as well as the uppermost border of the subscap.  The cuff was intact posteriorly and had a large crescent shaped tear noted affecting the supraspinatus extending back towards the anterior portion of the infraspinatus.  The scope was then placed in the subacromial space and it anterior lateral portals established 2.  Bursectomy was performed with the suction shaver and the ArthroCare wand combination.  The cuff was debrided, and due to its retraction, did not appear to be able to be returned to its native position.  A few millimeters of articular cartilage was then debrided away with the rotating bur, establishing a footprint for attachment of the cuff.  2 medial anchors were placed, with fiber tape which was brought through the cuff and then crisscrossed to establish a speed bridge.  A large dog ear would have been created with this crescent tear, so separate FiberWire was then placed into the apex of the crescent, with the posterior limb being brought to the posterior anchor and the anterior limb to the anterior anchor.  The posterior lateral anchor was then placed and the sutures tensioned appropriately.  The anterior was then placed, and the process of placing these, a third lateral portal was  established.  The cuff seemed to lay nicely into its bed and was well affixed with the construct.  Some additional bursal excision was performed and then  the rotating bur was used to effect a very gentle acromioplasty, particularly focusing on the anterolateral portion to prevent subacromial impingement, alleviating this from the cuff repair.  The instruments were then removed, the portals closed with staples and a dressing applied with a abduction pillow sling.  He was awakened and taken to the recovery room in stable condition, breathing spontaneously.  DISPOSITION: He will be discharged home with typical instructions, returning in 10-15 days.  At that time the staples will be removed at the wounds appear appropriate and will then set up formal rehab to begin.

## 2018-01-09 NOTE — Anesthesia Preprocedure Evaluation (Addendum)
Anesthesia Evaluation  Patient identified by MRN, date of birth, ID band Patient awake    Reviewed: Allergy & Precautions, NPO status , Patient's Chart, lab work & pertinent test results  History of Anesthesia Complications (+) PONV  Airway Mallampati: II  TM Distance: >3 FB Neck ROM: Full    Dental  (+) Dental Advisory Given   Pulmonary former smoker,    Pulmonary exam normal        Cardiovascular negative cardio ROS Normal cardiovascular exam     Neuro/Psych negative neurological ROS     GI/Hepatic Neg liver ROS, GERD  ,  Endo/Other  negative endocrine ROS  Renal/GU negative Renal ROS     Musculoskeletal   Abdominal   Peds  Hematology negative hematology ROS (+)   Anesthesia Other Findings   Reproductive/Obstetrics                             Anesthesia Physical Anesthesia Plan  ASA: II  Anesthesia Plan: General   Post-op Pain Management:  Regional for Post-op pain   Induction: Intravenous  PONV Risk Score and Plan: 3 and Dexamethasone, Ondansetron, Treatment may vary due to age or medical condition and Scopolamine patch - Pre-op  Airway Management Planned: Oral ETT  Additional Equipment:   Intra-op Plan:   Post-operative Plan: Extubation in OR  Informed Consent: I have reviewed the patients History and Physical, chart, labs and discussed the procedure including the risks, benefits and alternatives for the proposed anesthesia with the patient or authorized representative who has indicated his/her understanding and acceptance.   Dental advisory given  Plan Discussed with: CRNA  Anesthesia Plan Comments:        Anesthesia Quick Evaluation

## 2018-01-09 NOTE — Anesthesia Procedure Notes (Signed)
Procedure Name: Intubation Date/Time: 01/09/2018 9:53 AM Performed by: Talbot Grumbling, CRNA Pre-anesthesia Checklist: Patient identified, Emergency Drugs available, Suction available and Patient being monitored Patient Re-evaluated:Patient Re-evaluated prior to induction Oxygen Delivery Method: Circle system utilized Preoxygenation: Pre-oxygenation with 100% oxygen Induction Type: IV induction Ventilation: Mask ventilation without difficulty Laryngoscope Size: Mac and 3 Grade View: Grade I Tube type: Oral Tube size: 7.0 mm Number of attempts: 1 Airway Equipment and Method: Stylet Placement Confirmation: ETT inserted through vocal cords under direct vision,  positive ETCO2 and breath sounds checked- equal and bilateral Secured at: 24 cm Tube secured with: Tape Dental Injury: Teeth and Oropharynx as per pre-operative assessment

## 2018-01-09 NOTE — Progress Notes (Signed)
Assisted Dr. Rob Fitzgerald with right, ultrasound guided, interscalene  block. Side rails up, monitors on throughout procedure. See vital signs in flow sheet. Tolerated Procedure well. 

## 2018-01-09 NOTE — Anesthesia Procedure Notes (Addendum)
Anesthesia Regional Block: Interscalene brachial plexus block   Pre-Anesthetic Checklist: ,, timeout performed, Correct Patient, Correct Site, Correct Laterality, Correct Procedure, Correct Position, site marked, Risks and benefits discussed,  Surgical consent,  Pre-op evaluation,  At surgeon's request and post-op pain management  Laterality: Right  Prep: chloraprep       Needles:  Injection technique: Single-shot  Needle Type: Echogenic Stimulator Needle     Needle Length: 9cm  Needle Gauge: 21     Additional Needles:   Procedures:, nerve stimulator,,, ultrasound used (permanent image in chart),,,,   Nerve Stimulator or Paresthesia:  Response: deltoid and bicep, 0.5 mA,   Additional Responses:   Narrative:  Start time: 01/09/2018 9:02 AM End time: 01/09/2018 9:08 AM Injection made incrementally with aspirations every 5 mL.  Performed by: Personally  Anesthesiologist: Marcene DuosFitzgerald, Win Guajardo, MD

## 2018-01-09 NOTE — Discharge Instructions (Signed)
°  Post Anesthesia Home Care Instructions  Activity: Get plenty of rest for the remainder of the day. A responsible individual must stay with you for 24 hours following the procedure.  For the next 24 hours, DO NOT: -Drive a car -Advertising copywriterperate machinery -Drink alcoholic beverages -Take any medication unless instructed by your physician -Make any legal decisions or sign important papers.  Meals: Start with liquid foods such as gelatin or soup. Progress to regular foods as tolerated. Avoid greasy, spicy, heavy foods. If nausea and/or vomiting occur, drink only clear liquids until the nausea and/or vomiting subsides. Call your physician if vomiting continues.  Special Instructions/Symptoms: Your throat may feel dry or sore from the anesthesia or the breathing tube placed in your throat during surgery. If this causes discomfort, gargle with warm salt water. The discomfort should disappear within 24 hours.  If you had a scopolamine patch placed behind your ear for the management of post- operative nausea and/or vomiting:  1. The medication in the patch is effective for 72 hours, after which it should be removed.  Wrap patch in a tissue and discard in the trash. Wash hands thoroughly with soap and water. 2. You may remove the patch earlier than 72 hours if you experience unpleasant side effects which may include dry mouth, dizziness or visual disturbances. 3. Avoid touching the patch. Wash your hands with soap and water after contact with the patch.   Discharge Instructions   You have a light dressing on your shoulder.  You may begin gentle motion of your fingers and hand immediately, but you should not do any heavy lifting or gripping.  Elevate your hand to reduce pain & swelling of the digits.  Ice over the operative site may be helpful to reduce pain & swelling.  DO NOT USE HEAT. Wear your sling at all times except when showering. Do not attempt to move your shoulder. You may move fingers, elbow,  and wrist. Keep elbow touching your side if sling is off for showering. Pain medicine has been prescribed for you.  Continue to take Mobic as prescribed daily. Additionally, take tylenol 650 mg over the counter every 6 hours. Oxycodone 5 mg has been prescribed as a rescue medicine for severe pain. Leave the dressing in place until the third day after your surgery and then remove it, leaving it open to air.  After the bandage has been removed you may shower, regularly washing the incision and letting the water run over it, but not submerging it (no swimming, soaking it in dishwater, etc.) You may drive a car when you are off of prescription pain medications and can safely control your vehicle with both hands. We will address whether therapy  when you return to the office. You may have already made your follow-up appointment when we completed your preop visit.  If not, please call our office today or the next business day to make your return appointment for 10-15 days after surgery.   Please call [phone] during normal business hours or [phone] after hours for any problems. Including the following:  - excessive redness of the incisions - drainage for more than 4 days - fever of more than 101.5 F  *Please note that pain medications will not be refilled after hours or on weekends.  WORK STATUS: Out of work until return appointment.

## 2018-01-09 NOTE — Progress Notes (Signed)
Pt with lagging oxygen sats, encouraged coughing and deep breathing, Incentive spirometry instructions given as well, Sats remain in high 80's low 90's, lungs CTA, Dr. Renold DonGermeroth informed, by to see patient and ordered given for Chest Xray.  Will discharge home if xray is negative.

## 2018-01-09 NOTE — Interval H&P Note (Signed)
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 After consideration of risks, benefits and other options for treatment, the patient has consented to  Procedure(s): SHOULDER ARTHROSCOPY WITH ROTATOR CUFF REPAIR,SUBACROMIAL DECOMPRESSION, BICEPS TENOTOMY (Right) as a surgical intervention .  The patient's history has been reviewed, patient examined, no change in status, stable for surgery.  I have reviewed the patient's chart and labs.  Questions were answered to the patient's satisfaction.     Jodi Marbleavid A Lynore Coscia

## 2018-01-10 ENCOUNTER — Encounter (HOSPITAL_BASED_OUTPATIENT_CLINIC_OR_DEPARTMENT_OTHER): Payer: Self-pay | Admitting: Orthopedic Surgery

## 2018-12-29 ENCOUNTER — Other Ambulatory Visit: Payer: Self-pay

## 2018-12-29 ENCOUNTER — Emergency Department (HOSPITAL_COMMUNITY): Payer: BLUE CROSS/BLUE SHIELD

## 2018-12-29 ENCOUNTER — Emergency Department (HOSPITAL_COMMUNITY)
Admission: EM | Admit: 2018-12-29 | Discharge: 2018-12-29 | Disposition: A | Discharge status: Home / Self Care | Payer: BLUE CROSS/BLUE SHIELD | Attending: Emergency Medicine | Admitting: Emergency Medicine

## 2018-12-29 ENCOUNTER — Encounter (HOSPITAL_COMMUNITY): Payer: Self-pay

## 2018-12-29 DIAGNOSIS — J1289 Other viral pneumonia: Secondary | ICD-10-CM | POA: Insufficient documentation

## 2018-12-29 DIAGNOSIS — J1282 Pneumonia due to coronavirus disease 2019: Secondary | ICD-10-CM

## 2018-12-29 DIAGNOSIS — R509 Fever, unspecified: Secondary | ICD-10-CM | POA: Diagnosis present

## 2018-12-29 DIAGNOSIS — U071 COVID-19: Secondary | ICD-10-CM | POA: Insufficient documentation

## 2018-12-29 DIAGNOSIS — Z87891 Personal history of nicotine dependence: Secondary | ICD-10-CM | POA: Diagnosis not present

## 2018-12-29 LAB — COMPREHENSIVE METABOLIC PANEL
ALT: 39 U/L (ref 0–44)
AST: 47 U/L — ABNORMAL HIGH (ref 15–41)
Albumin: 4 g/dL (ref 3.5–5.0)
Alkaline Phosphatase: 65 U/L (ref 38–126)
Anion gap: 12 (ref 5–15)
BUN: 10 mg/dL (ref 6–20)
CO2: 25 mmol/L (ref 22–32)
Calcium: 8.7 mg/dL — ABNORMAL LOW (ref 8.9–10.3)
Chloride: 99 mmol/L (ref 98–111)
Creatinine, Ser: 1.1 mg/dL (ref 0.61–1.24)
GFR calc Af Amer: >60 mL/min (ref >60)
GFR calc non Af Amer: >60 mL/min (ref >60)
Glucose, Bld: 91 mg/dL (ref 70–99)
Potassium: 3.9 mmol/L (ref 3.5–5.1)
Sodium: 136 mmol/L (ref 135–145)
Total Bilirubin: 0.8 mg/dL (ref 0.3–1.2)
Total Protein: 6.9 g/dL (ref 6.5–8.1)

## 2018-12-29 LAB — CBC WITH DIFFERENTIAL/PLATELET
Abs Immature Granulocytes: 0.02 10*3/uL (ref 0.00–0.07)
Basophils Absolute: 0 10*3/uL (ref 0.0–0.1)
Basophils Relative: 0 %
Eosinophils Absolute: 0 10*3/uL (ref 0.0–0.5)
Eosinophils Relative: 0 %
HCT: 43.9 % (ref 39.0–52.0)
Hemoglobin: 14.3 g/dL (ref 13.0–17.0)
Immature Granulocytes: 1 %
Lymphocytes Relative: 15 %
Lymphs Abs: 0.5 10*3/uL — ABNORMAL LOW (ref 0.7–4.0)
MCH: 30.9 pg (ref 26.0–34.0)
MCHC: 32.6 g/dL (ref 30.0–36.0)
MCV: 94.8 fL (ref 80.0–100.0)
Monocytes Absolute: 0.2 10*3/uL (ref 0.1–1.0)
Monocytes Relative: 5 %
Neutro Abs: 2.8 10*3/uL (ref 1.7–7.7)
Neutrophils Relative %: 79 %
Platelets: 137 10*3/uL — ABNORMAL LOW (ref 150–400)
RBC: 4.63 MIL/uL (ref 4.22–5.81)
RDW: 12.9 % (ref 11.5–15.5)
WBC: 3.5 10*3/uL — ABNORMAL LOW (ref 4.0–10.5)
nRBC: 0 % (ref 0.0–0.2)

## 2018-12-29 LAB — URINALYSIS, ROUTINE W REFLEX MICROSCOPIC
Bilirubin Urine: NEGATIVE
Glucose, UA: NEGATIVE mg/dL
Hgb urine dipstick: NEGATIVE
Ketones, ur: 20 mg/dL — AB
Leukocytes,Ua: NEGATIVE
Nitrite: NEGATIVE
Protein, ur: NEGATIVE mg/dL
Specific Gravity, Urine: >1.046 — ABNORMAL HIGH (ref 1.005–1.030)
pH: 6 (ref 5.0–8.0)

## 2018-12-29 LAB — LIPASE, BLOOD: Lipase: 55 U/L — ABNORMAL HIGH (ref 11–51)

## 2018-12-29 LAB — TROPONIN I: Troponin I: <0.03 ng/mL (ref <0.03)

## 2018-12-29 LAB — SARS CORONAVIRUS 2: SARS Coronavirus 2: DETECTED — AB

## 2018-12-29 LAB — LACTIC ACID, PLASMA: Lactic Acid, Venous: 0.9 mmol/L (ref 0.5–1.9)

## 2018-12-29 MED ORDER — ACETAMINOPHEN 500 MG PO TABS
1000.0000 mg | ORAL_TABLET | Freq: Once | ORAL | Status: AC
Start: 2018-12-29 — End: 2018-12-29
  Administered 2018-12-29: 1000 mg via ORAL
  Filled 2018-12-29: qty 2

## 2018-12-29 MED ORDER — IOHEXOL 300 MG/ML  SOLN
100.0000 mL | Freq: Once | INTRAMUSCULAR | Status: AC | PRN
  Administered 2018-12-29: 100 mL via INTRAVENOUS

## 2018-12-29 MED ORDER — SODIUM CHLORIDE 0.9 % IV BOLUS
1000.0000 mL | Freq: Once | INTRAVENOUS | Status: AC
  Administered 2018-12-29: 1000 mL via INTRAVENOUS

## 2018-12-29 MED ORDER — ONDANSETRON HCL 4 MG/2ML IJ SOLN
4.0000 mg | Freq: Once | INTRAMUSCULAR | Status: AC
  Administered 2018-12-29: 4 mg via INTRAVENOUS
  Filled 2018-12-29: qty 2

## 2018-12-29 MED ORDER — DOXYCYCLINE HYCLATE 100 MG PO CAPS: 100.0000 mg | ORAL_CAPSULE | Freq: Two times a day (BID) | ORAL | 0 refills | Status: DC

## 2018-12-29 NOTE — ED Notes (Signed)
Patient transported to CT 

## 2018-12-29 NOTE — ED Notes (Signed)
Pt ambulated, SPO2 >93%

## 2018-12-29 NOTE — ED Provider Notes (Signed)
[facility] EMERGENCY DEPARTMENT Provider Note   CSN: [id number] Arrival date & time: 12/29/18  0559    History   Chief Complaint Chief Complaint  Patient presents with   Fever    HPI Joel Lawrence is a 58 y.o. male.     Joel Lawrence is a 58 y.o. male with a history of headaches, GERD, hypertension, seizures and palpitations, who presents to the emergency department for evaluation of fevers.  Patient reports that for the past 4 days he has had fevers, shivers chills and body aches every morning when he is gotten off work and gone to go to sleep, he finally got a thermometer and checked his temperature yesterday which showed he had a temp of 102.  He reports that over the past 3 to 4 days he has had generalized body aches, intermittent headaches and over the past day he is developed some cough occasionally productive as well as some shortness of breath, he denies chest pain but does report that last night he had an occasional "pinching sensation" in his chest.  He also reports that over the past few days he has had some lower abdominal pain that seems to be worse when he gets up from sleeping and has also had decreased appetite due to nausea and several episodes of nonbloody diarrhea today.  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  No sick contacts at home.  He has not taken anything for his symptoms prior to arrival, no other aggravating or alleviating factors.     Past Medical History:  Diagnosis Date   Anginal pain (HCC)    Anxiety    body shakes   Chest pain    Dysrhythmia    Palpations, when really stress   GERD (gastroesophageal reflux disease)    Headache(784.0)    04/05/14- bad headache this week   HOH (hard of hearing)    PONV (postoperative nausea and vomiting)    not last time   Seizures (HCC)    hx of as a child no problems  now     Patient Active Problem List   Diagnosis Date Noted   Pre-op evaluation 04/11/2014   GERD (gastroesophageal reflux disease) 04/11/2014   Right inguinal hernia 03/18/2014   Ventral hernia 09/16/2011   Recurrent left inguinal hernia 09/16/2011    Past Surgical History:  Procedure Laterality Date   HERNIA REPAIR  09/01/2007   Left Inguinal hernia   HERNIA REPAIR  07/06/2010   recurrent left inguinal hernia   INGUINAL HERNIA REPAIR  10/20/2011   Procedure: LAPAROSCOPIC INGUINAL HERNIA;  Surgeon: Wilmon Arms. Corliss Skains, MD;  Location: [facility];  Service: General;  Laterality: Left;  recurrent   INGUINAL HERNIA REPAIR Right 04/19/2014   Procedure: RIGHT INGUINAL HERNIA REPAIR WITH MESH;  Surgeon: Manus Rudd, MD;  Location: [facility];  Service: General;  Laterality: Right;   INSERTION OF MESH Right 04/19/2014   Procedure: INSERTION OF MESH;  Surgeon: Manus Rudd, MD;  Location: [facility];  Service: General;  Laterality: Right;   SHOULDER ARTHROSCOPY WITH BICEPSTENOTOMY Right 01/09/2018   Procedure: SHOULDER ARTHROSCOPY WITH BICEPSTENOTOMY;  Surgeon: Mack Hook, MD;  Location: [facility];  Service: Orthopedics;  Laterality: Right;   SHOULDER ARTHROSCOPY WITH ROTATOR CUFF REPAIR Right 01/09/2018   Procedure: SHOULDER ARTHROSCOPY WITH ROTATOR CUFF REPAIR,SUBACROMIAL DECOMPRESSION, BICEPS TENOTOMY;  Surgeon: Mack Hook, MD;  Location: [facility];  Service: Orthopedics;  Laterality: Right;   VENTRAL HERNIA REPAIR  10/20/2011   Procedure: LAPAROSCOPIC VENTRAL HERNIA;  Surgeon: Wilmon Arms. Corliss Skains, MD;  Location: [facility];  Service: General;  Laterality: N/A;        Home Medications    Prior to Admission medications   Medication Sig Start Date End Date Taking? Authorizing Provider  MILK THISTLE PO Take 2 tablets by mouth 2 (two) times daily.    Yes [provider]  acetaminophen (TYLENOL) 325 MG tablet Take 2 tablets (650 mg total) by mouth  every 6 (six) hours. Patient not taking: Reported on 12/29/2018 01/09/18   Mack Hook, MD  doxycycline (VIBRAMYCIN) 100 MG capsule Take 1 capsule (100 mg total) by mouth 2 (two) times daily. One po bid x 7 days 12/29/18   Dartha Lodge, PA-C  oxyCODONE (ROXICODONE) 5 MG immediate release tablet Take 1 tablet (5 mg total) by mouth every 6 (six) hours as needed for severe pain or breakthrough pain. Patient not taking: Reported on 12/29/2018 01/09/18   Mack Hook, MD    Family History Family History  Problem Relation Age of Onset   Cancer Father        lung   Heart disease Father     Social History Social History   Tobacco Use   Smoking status: Former Smoker    Last attempt to quit: 07/26/1978    Years since quitting: 40.4   Smokeless tobacco: Never Used  Substance Use Topics   Alcohol use: No   Drug use: No     Allergies   Other   Review of Systems Review of Systems  Constitutional: Positive for chills and fever.  HENT: Negative for congestion, rhinorrhea and sore throat.   Eyes: Negative for visual disturbance.  Respiratory: Positive for cough and shortness of breath. Negative for chest tightness.   Cardiovascular: Negative for chest pain and leg swelling.  Gastrointestinal: Positive for abdominal pain, diarrhea and nausea. Negative for constipation and vomiting.  Genitourinary: Negative for dysuria and frequency.  Musculoskeletal: Positive for myalgias.  Skin: Negative for color change and rash.  Neurological: Positive for headaches. Negative for dizziness, syncope, weakness, light-headedness and numbness.     Physical Exam Updated Vital Signs BP 134/81    Pulse 87    Temp (!) 102 F (38.9 C) (Oral)    Resp 13    Ht 6' (1.829 m)    Wt 102.1 kg    SpO2 98%    BMI 30.52 kg/m   Physical Exam Vitals signs and nursing note reviewed.  Constitutional:      General: He is not in acute distress.    Appearance: Normal appearance. He is well-developed and normal  weight. He is not diaphoretic.     Comments: Mildly ill-appearing but in no acute distress.  HENT:     Head: Normocephalic and atraumatic.     Mouth/Throat:     Mouth: Mucous membranes are moist.     Pharynx: Oropharynx is clear.  Eyes:     General:        Right eye: No discharge.        Left eye: No discharge.     Pupils: Pupils are equal, round, and reactive to light.  Neck:     Musculoskeletal: Neck supple.  Cardiovascular:     Rate and Rhythm: Normal rate and regular rhythm.     Pulses: Normal pulses.     Heart sounds: Normal  heart sounds. No murmur. No friction rub. No gallop.   Pulmonary:     Effort: Pulmonary effort is normal. No respiratory distress.     Breath sounds: Normal breath sounds. No wheezing or rales.     Comments: Respirations equal and unlabored, patient able to speak in full sentences, lungs clear to auscultation bilaterally Abdominal:     General: Bowel sounds are normal. There is no distension.     Palpations: Abdomen is soft. There is no mass.     Tenderness: There is abdominal tenderness. There is no guarding.     Comments: Abdomen is soft and nondistended, bowel sounds present throughout, there is some tenderness across the lower abdomen, most pronounced in the left lower quadrant, no guarding or peritoneal signs, all other quadrants nontender to palpation.  Musculoskeletal:        General: No deformity.  Skin:    General: Skin is warm and dry.     Capillary Refill: Capillary refill takes less than 2 seconds.  Neurological:     Mental Status: He is alert.     Coordination: Coordination normal.     Comments: Speech is clear, able to follow commands CN III-XII intact Normal strength in upper and lower extremities bilaterally including dorsiflexion and plantar flexion, strong and equal grip strength Sensation normal to light and sharp touch Moves extremities without ataxia, coordination intact   Psychiatric:        Mood and Affect: Mood normal.         Behavior: Behavior normal.      ED Treatments / Results  Labs (all labs ordered are listed, but only abnormal results are displayed) Labs Reviewed  SARS CORONAVIRUS 2 - Abnormal; Notable for the following components:      Result Value   SARS Coronavirus 2 DETECTED (*)    All other components within normal limits  COMPREHENSIVE METABOLIC PANEL - Abnormal; Notable for the following components:   Calcium 8.7 (*)    AST 47 (*)    All other components within normal limits  CBC WITH DIFFERENTIAL/PLATELET - Abnormal; Notable for the following components:   WBC 3.5 (*)    Platelets 137 (*)    Lymphs Abs 0.5 (*)    All other components within normal limits  LIPASE, BLOOD - Abnormal; Notable for the following components:   Lipase 55 (*)    All other components within normal limits  URINALYSIS, ROUTINE W REFLEX MICROSCOPIC - Abnormal; Notable for the following components:   Specific Gravity, Urine >1.046 (*)    Ketones, ur 20 (*)    All other components within normal limits  CULTURE, BLOOD (ROUTINE X 2)  CULTURE, BLOOD (ROUTINE X 2)  LACTIC ACID, PLASMA  TROPONIN I    EKG EKG Interpretation  Date/Time:  Friday December 29 2018 07:31:18 EDT Ventricular Rate:  93 PR Interval:    QRS Duration: 112 QT Interval:  355 QTC Calculation: 442 R Axis:   52 Text Interpretation:  Sinus tachycardia Multiple ventricular premature complexes Consider left atrial enlargement Borderline intraventricular conduction delay Borderline low voltage, extremity leads freqent pvcs Otherwise no significant change Confirmed by Melene Plan [phone]) on 12/29/2018 9:04:15 AM   Radiology Dg Chest 1 View  Result Date: 12/29/2018 CLINICAL DATA:  58 year old male with shortness of breath cough and fever for 5 days. EXAM: CHEST  1 VIEW COMPARISON:  01/09/2018 and earlier. FINDINGS: Portable AP semi upright view at 0757 hours. Lung volumes and mediastinal contours are within normal limits. Visualized  tracheal air column is  within normal limits. No pneumothorax, pulmonary edema, pleural effusion or confluent pulmonary opacity. Mild chronic basilar predominant increased interstitial markings appear stable. No acute osseous abnormality identified. IMPRESSION: No acute cardiopulmonary abnormality. Electronically Signed   By: Odessa Fleming M.D.   On: 12/29/2018 08:32   Ct Abdomen Pelvis W Contrast  Result Date: 12/29/2018 CLINICAL DATA:  58 year old male with abdominal pain, fever one hundred two F. Positive COVID-19. EXAM: CT ABDOMEN AND PELVIS WITH CONTRAST TECHNIQUE: Multidetector CT imaging of the abdomen and pelvis was performed using the standard protocol following bolus administration of intravenous contrast. CONTRAST:  OMNIPAQUE IOHEXOL 300 MG/ML  SOLN COMPARISON:  CT Abdomen and Pelvis 02/26/2014 and earlier. FINDINGS: Lower chest: Multifocal Patchy and confluent areas of ground-glass opacity at the bilateral lung bases involving all lobes. No cardiomegaly or pericardial effusion. No pleural effusion. Hepatobiliary: Negative liver and gallbladder. Pancreas: Negative. Spleen: Negative. Adrenals/Urinary Tract: Normal adrenal glands. Bilateral renal enhancement and contrast excretion is symmetric and within normal limits. No nephrolithiasis. Trace nonspecific perinephric stranding. Decompressed ureters. Unremarkable urinary bladder. Stomach/Bowel: Negative rectosigmoid colon, trace diverticulosis at the junction of the descending and sigmoid. Mild retained stool from the mid transverse colon distally. There is fluid from the right colon to the mid transverse. Similar fluid-filled small bowel loops. But no dilated large or small bowel. The cecum is on a lax mesentery located in the anterior right upper quadrant. No large bowel wall thickening. Negative terminal ileum. Diminutive and negative appendix. Decompressed stomach. No mesenteric stranding. No free air, free fluid. Vascular/Lymphatic: Major arterial structures are patent.  Portal venous system is patent. No lymphadenopathy. Reproductive: Negative. Other: No pelvic free fluid. Musculoskeletal: Progressed lower lumbar endplate degeneration since 2015. No acute osseous abnormality identified. IMPRESSION: 1. Bilateral lower lung COVID-19 pneumonia.  No pleural effusion. 2. No bowel obstruction or focal bowel inflammation. Normal appendix. 3. Mild nonspecific perinephric stranding. Query acute renal insufficiency. No obstructive uropathy. Electronically Signed   By: Odessa Fleming M.D.   On: 12/29/2018 10:05    Procedures Procedures (including critical care time)  Medications Ordered in ED Medications  ondansetron (ZOFRAN) injection 4 mg (4 mg Intravenous Given 12/29/18 0742)  sodium chloride 0.9 % bolus 1,000 mL (0 mLs Intravenous Stopped 12/29/18 1024)  acetaminophen (TYLENOL) tablet 1,000 mg (1,000 mg Oral Given 12/29/18 0741)  iohexol (OMNIPAQUE) 300 MG/ML solution 100 mL (100 mLs Intravenous Contrast Given 12/29/18 0924)     Initial Impression / Assessment and Plan / ED Course  I have reviewed the triage vital signs and the nursing notes.  Pertinent labs & imaging results that were available during my care of the patient were reviewed by me and considered in my medical decision making (see chart for details).  Patient presents for 4 days of fevers and chills, on arrival he is febrile to 103 but all other vitals normal.  He reports chills and fevers, and today developed cough and shortness of breath has also had some lower abdominal pain, diarrhea and nausea without vomiting.  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  Patient looks as though he does not feel well but he does not appear toxic and vitals are reassuring.  Will give Tylenol and fluids for fever, will test for coronavirus will check chest x-ray, labs, lactic acid, blood cultures will also check CT abdomen pelvis given tenderness in the left lower quadrant,  although he has no  peritoneal signs.  Given that aside from fever patient has no other sirs criteria code sepsis not initiated will hold off on antibiotics until source is identified, may be viral.   EKG shows sinus rhythm with no concerning changes, chest x-ray is unremarkable.  Patient does have a leukopenia with white blood cell count of 3.5, normal hemoglobin, platelets slightly low at 137, no acute electrolyte derangements requiring intervention, normal renal and liver function, lipase is minimally elevated at 55, patient does not have focal epigastric or periumbilical tenderness.  His lactic acid is not elevated, troponin is negative.  Urinalysis does not show signs of infection.  Coronavirus test was positive.  CT abdomen pelvis does show COVID pneumonia in bilateral lower lobes of the lungs but does not show any acute findings within the abdomen.  Fever has resolved with Tylenol and all other vitals have remained stable, no episodes of hypoxia.  Discussed with patient positive COVID test but the rest of his work-up has been reassuring, CT scan does show COVID pneumonia in the lung bases but he has been ambulatory in the department and maintained O2 sats greater than 93% with no increased work of breathing.  Discussed with Dr. Adela LankFloyd who recommends prescribing doxycycline in case there is any bacterial element and pneumonia.  I had long discussion with patient about strict return precautions for return if any clinical worsening, I have also encouraged him to purchase a home pulse ox so that he can monitor his O2 sats.  He expresses understanding and agreement with this plan.  Discharged home in good condition.  Joel Lawrence was evaluated in Emergency Department on 12/29/2018 for the symptoms described in the history of present illness. He was evaluated in the context of the global COVID-19 pandemic, which necessitated consideration that the patient might be at risk for infection with the SARS-CoV-2  virus that causes COVID-19. Institutional protocols and algorithms that pertain to the evaluation of patients at risk for COVID-19 are in a state of rapid change based on information released by regulatory bodies including the CDC and federal and state organizations. These policies and algorithms were followed during the patient's care in the ED.   Final Clinical Impressions(s) / ED Diagnoses   Final diagnoses:  Pneumonia due to COVID-19 virus    ED Discharge Orders         Ordered    doxycycline (VIBRAMYCIN) 100 MG capsule  2 times daily     12/29/18 1040           Dartha LodgeFord, Jamarkus Lisbon N, [place] JerseyPA-C 12/29/18 1113    Melene PlanFloyd, Dan, DO 12/29/18 1515

## 2018-12-29 NOTE — ED Notes (Signed)
Pt discharged with all belongings. Discharge instructions reviewed with pt, pt verbalized understanding. Opportunity for questions provided. Pt ambulatory at discharge.  

## 2018-12-29 NOTE — ED Notes (Signed)
Pt stating he will update his family regarding plan of care, does not need staff to assist.

## 2018-12-29 NOTE — Discharge Instructions (Addendum)
You have tested positive for COVID-19 virus.  Please continue to quarantine at home and monitor your symptoms closely. Your CT scan does show some evidence of pneumonia, and this is likely caused by the virus but we will prescribe doxycycline and antibiotic just in case there is a bacterial element to this infection.  This infection will likely run its course over the next 10 days.  Please make sure you are drinking plenty of fluids. You can treat your symptoms supportively with tylenol for fevers and pains, and over the counter cough syrups and throat lozenges to help with cough. If your symptoms are not improving please follow up with you Primary doctor.   I recommend that you purchase a home pulse ox to help better monitor your oxygen at home, if you start to have increased work of breathing or shortness of breath or your oxygen drops below 90% please immediately return to the hospital for reevaluation.  If you develop persistent fevers, shortness of breath or difficulty breathing, chest pain, severe headache and neck pain, persistent nausea and vomiting or other new or concerning symptoms return to the Emergency department.

## 2018-12-29 NOTE — ED Triage Notes (Signed)
Pt POV from work d/t having shivers, fevers, and body aches that started approx 4 days ago. Pt temp at work was 102F

## 2019-01-03 LAB — CULTURE, BLOOD (ROUTINE X 2)
Culture: NO GROWTH
Culture: NO GROWTH
Special Requests: ADEQUATE
Special Requests: ADEQUATE

## 2019-06-10 ENCOUNTER — Other Ambulatory Visit: Payer: Self-pay

## 2019-06-10 ENCOUNTER — Encounter (HOSPITAL_COMMUNITY): Payer: Self-pay | Admitting: *Deleted

## 2019-06-10 ENCOUNTER — Inpatient Hospital Stay (HOSPITAL_COMMUNITY)
Admission: EM | Admit: 2019-06-10 | Discharge: 2019-06-12 | DRG: 419 | Disposition: A | Discharge status: Home / Self Care | Payer: BC Managed Care – PPO | Attending: Surgery | Admitting: Surgery

## 2019-06-10 ENCOUNTER — Emergency Department (HOSPITAL_COMMUNITY): Payer: BC Managed Care – PPO

## 2019-06-10 DIAGNOSIS — Z87891 Personal history of nicotine dependence: Secondary | ICD-10-CM | POA: Diagnosis not present

## 2019-06-10 DIAGNOSIS — Z8249 Family history of ischemic heart disease and other diseases of the circulatory system: Secondary | ICD-10-CM | POA: Diagnosis not present

## 2019-06-10 DIAGNOSIS — R1011 Right upper quadrant pain: Secondary | ICD-10-CM | POA: Diagnosis present

## 2019-06-10 DIAGNOSIS — K660 Peritoneal adhesions (postprocedural) (postinfection): Secondary | ICD-10-CM | POA: Diagnosis present

## 2019-06-10 DIAGNOSIS — Z801 Family history of malignant neoplasm of trachea, bronchus and lung: Secondary | ICD-10-CM | POA: Diagnosis not present

## 2019-06-10 DIAGNOSIS — K8020 Calculus of gallbladder without cholecystitis without obstruction: Secondary | ICD-10-CM | POA: Diagnosis present

## 2019-06-10 DIAGNOSIS — Z20828 Contact with and (suspected) exposure to other viral communicable diseases: Secondary | ICD-10-CM | POA: Diagnosis present

## 2019-06-10 DIAGNOSIS — K8000 Calculus of gallbladder with acute cholecystitis without obstruction: Principal | ICD-10-CM | POA: Diagnosis present

## 2019-06-10 LAB — CBC
HCT: 40.7 % (ref 39.0–52.0)
Hemoglobin: 12.8 g/dL — ABNORMAL LOW (ref 13.0–17.0)
MCH: 30.7 pg (ref 26.0–34.0)
MCHC: 31.4 g/dL (ref 30.0–36.0)
MCV: 97.6 fL (ref 80.0–100.0)
Platelets: 383 10*3/uL (ref 150–400)
RBC: 4.17 MIL/uL — ABNORMAL LOW (ref 4.22–5.81)
RDW: 13.5 % (ref 11.5–15.5)
WBC: 7.7 10*3/uL (ref 4.0–10.5)
nRBC: 0 % (ref 0.0–0.2)

## 2019-06-10 LAB — COMPREHENSIVE METABOLIC PANEL
ALT: 29 U/L (ref 0–44)
AST: 25 U/L (ref 15–41)
Albumin: 3.5 g/dL (ref 3.5–5.0)
Alkaline Phosphatase: 45 U/L (ref 38–126)
Anion gap: 10 (ref 5–15)
BUN: 14 mg/dL (ref 6–20)
CO2: 25 mmol/L (ref 22–32)
Calcium: 9 mg/dL (ref 8.9–10.3)
Chloride: 104 mmol/L (ref 98–111)
Creatinine, Ser: 1.08 mg/dL (ref 0.61–1.24)
GFR calc Af Amer: >60 mL/min (ref >60)
GFR calc non Af Amer: >60 mL/min (ref >60)
Glucose, Bld: 92 mg/dL (ref 70–99)
Potassium: 4.3 mmol/L (ref 3.5–5.1)
Sodium: 139 mmol/L (ref 135–145)
Total Bilirubin: 0.5 mg/dL (ref 0.3–1.2)
Total Protein: 6.6 g/dL (ref 6.5–8.1)

## 2019-06-10 LAB — POC OCCULT BLOOD, ED: Fecal Occult Bld: NEGATIVE

## 2019-06-10 LAB — TROPONIN I (HIGH SENSITIVITY)
Troponin I (High Sensitivity): 9 ng/L (ref <18)
Troponin I (High Sensitivity): 9 ng/L (ref <18)

## 2019-06-10 LAB — LIPASE, BLOOD: Lipase: 48 U/L (ref 11–51)

## 2019-06-10 LAB — ABO/RH: ABO/RH(D): O POS

## 2019-06-10 LAB — TYPE AND SCREEN
ABO/RH(D): O POS
Antibody Screen: NEGATIVE

## 2019-06-10 LAB — SARS CORONAVIRUS 2 (TAT 6-24 HRS): SARS Coronavirus 2: NEGATIVE

## 2019-06-10 MED ORDER — ACETAMINOPHEN 325 MG PO TABS: 650.0000 mg | ORAL_TABLET | Freq: Four times a day (QID) | ORAL | Status: DC | PRN

## 2019-06-10 MED ORDER — ONDANSETRON 4 MG PO TBDP: 4.0000 mg | ORAL_TABLET | Freq: Four times a day (QID) | ORAL | Status: DC | PRN

## 2019-06-10 MED ORDER — ENOXAPARIN SODIUM 40 MG/0.4ML ~~LOC~~ SOLN
40.0000 mg | SUBCUTANEOUS | Status: DC
  Administered 2019-06-10 – 2019-06-11 (×2): 40 mg via SUBCUTANEOUS
  Filled 2019-06-10 (×2): qty 0.4

## 2019-06-10 MED ORDER — ACETAMINOPHEN 650 MG RE SUPP: 650.0000 mg | Freq: Four times a day (QID) | RECTAL | Status: DC | PRN

## 2019-06-10 MED ORDER — SODIUM CHLORIDE 0.9 % IV SOLN
2.0000 g | INTRAVENOUS | Status: DC
  Administered 2019-06-10 – 2019-06-11 (×2): 2 g via INTRAVENOUS
  Filled 2019-06-10: qty 20
  Filled 2019-06-10 (×2): qty 2

## 2019-06-10 MED ORDER — OXYCODONE HCL 5 MG PO TABS
5.0000 mg | ORAL_TABLET | ORAL | Status: DC | PRN
  Administered 2019-06-10: 10 mg via ORAL
  Administered 2019-06-11 – 2019-06-12 (×3): 5 mg via ORAL
  Filled 2019-06-10 (×3): qty 1
  Filled 2019-06-10: qty 2

## 2019-06-10 MED ORDER — MORPHINE SULFATE (PF) 2 MG/ML IV SOLN: 2.0000 mg | INTRAVENOUS | Status: DC | PRN

## 2019-06-10 MED ORDER — METOPROLOL TARTRATE 5 MG/5ML IV SOLN: 5.0000 mg | Freq: Four times a day (QID) | INTRAVENOUS | Status: DC | PRN

## 2019-06-10 MED ORDER — SODIUM CHLORIDE 0.9 % IV SOLN
INTRAVENOUS | Status: DC
  Administered 2019-06-10 – 2019-06-11 (×3): via INTRAVENOUS

## 2019-06-10 MED ORDER — IOHEXOL 300 MG/ML  SOLN
100.0000 mL | Freq: Once | INTRAMUSCULAR | Status: AC | PRN
  Administered 2019-06-10: 10:00:00 100 mL via INTRAVENOUS

## 2019-06-10 MED ORDER — PANTOPRAZOLE SODIUM 40 MG IV SOLR
40.0000 mg | Freq: Once | INTRAVENOUS | Status: AC
  Administered 2019-06-10: 40 mg via INTRAVENOUS
  Filled 2019-06-10: qty 40

## 2019-06-10 MED ORDER — ONDANSETRON HCL 4 MG/2ML IJ SOLN: 4.0000 mg | Freq: Four times a day (QID) | INTRAMUSCULAR | Status: DC | PRN

## 2019-06-10 NOTE — ED Provider Notes (Signed)
[facility] EMERGENCY DEPARTMENT Provider Note   CSN: [id number] Arrival date & time: 06/10/19  4098     History   Chief Complaint Chief Complaint  Patient presents with  . Abdominal Pain    HPI Joel Lawrence is a 58 y.o. male.     Patient with history of GERD, anxiety presenting with upper abdominal pain for the past 3 days.  States he has pain in his upper abdomen or right upper quadrant that is constant.  It is somewhat worse with palpation and better with eating.  He has not had any nausea, vomiting or diarrhea.  He believes his pain in his left shoulder for when he got sick after eating at a restaurant about 2 weeks ago.  States he had some baked chicken on October 30 and began to feel ill after this.  Had multiple episodes of nausea and vomiting he states was dark and believes it was blood.  He has not had any vomiting since October 30.  He states from a week after that he had black stools which is also gone back to normal.  Bowel movements are normal now.  Denies any dark or bloody stools currently.  Uses aspirin but no other anticoagulation.  No history of excessive alcohol use.  Does have a history of previous abdominal surgery including inguinal hernia repair and ventral hernia repair.  No history of EGD or colonoscopy.  Still has appendix and gallbladder.  Denies chest pain, shortness of breath, cough, fever.  The history is provided by the patient.  Abdominal Pain Associated symptoms: nausea and vomiting   Associated symptoms: no dysuria, no fever and no hematuria     Past Medical History:  Diagnosis Date  . Anginal pain (HCC)   . Anxiety    body shakes  . Chest pain   . Dysrhythmia    Palpations, when really stress  . GERD (gastroesophageal reflux disease)   . Headache(784.0)    04/05/14- bad headache this week  . HOH (hard of hearing)   . PONV (postoperative nausea and vomiting)    not last time  . Seizures (HCC)    hx of as a child no  problems now     Patient Active Problem List   Diagnosis Date Noted  . Pre-op evaluation 04/11/2014  . GERD (gastroesophageal reflux disease) 04/11/2014  . Right inguinal hernia 03/18/2014  . Ventral hernia 09/16/2011  . Recurrent left inguinal hernia 09/16/2011    Past Surgical History:  Procedure Laterality Date  . HERNIA REPAIR  09/01/2007   Left Inguinal hernia  . HERNIA REPAIR  07/06/2010   recurrent left inguinal hernia  . INGUINAL HERNIA REPAIR  10/20/2011   Procedure: LAPAROSCOPIC INGUINAL HERNIA;  Surgeon: Wilmon Arms. Corliss Skains, MD;  Location: [facility];  Service: General;  Laterality: Left;  recurrent  . INGUINAL HERNIA REPAIR Right 04/19/2014   Procedure: RIGHT INGUINAL HERNIA REPAIR WITH MESH;  Surgeon: Manus Rudd, MD;  [facility]: [facility];  Service: General;  Laterality: Right;  . INSERTION OF MESH Right 04/19/2014   Procedure: INSERTION OF MESH;  Surgeon: Manus Rudd, MD;  [facility]: [facility];  Service: General;  Laterality: Right;  . SHOULDER ARTHROSCOPY WITH BICEPSTENOTOMY Right 01/09/2018   Procedure: SHOULDER ARTHROSCOPY WITH BICEPSTENOTOMY;  Surgeon: Mack Hook, MD;  Location: [facility];  Service: Orthopedics;  Laterality: Right;  . SHOULDER ARTHROSCOPY WITH ROTATOR CUFF REPAIR Right 01/09/2018   Procedure: SHOULDER ARTHROSCOPY WITH ROTATOR CUFF REPAIR,SUBACROMIAL DECOMPRESSION, BICEPS TENOTOMY;  Surgeon: Mack Hook, MD;  Location: [facility];  Service: Orthopedics;  Laterality: Right;  . VENTRAL HERNIA REPAIR  10/20/2011   Procedure: LAPAROSCOPIC VENTRAL HERNIA;  Surgeon: Wilmon Arms. Corliss Skains, MD;  Location: [facility];  Service: General;  Laterality: N/A;        Home Medications    Prior to Admission medications   Medication Sig Start Date End Date Taking? Authorizing Provider  acetaminophen (TYLENOL) 325 MG tablet Take 2 tablets (650 mg total) by mouth every 6 (six) hours. Patient not taking: Reported on 12/29/2018 01/09/18   Mack Hook,  MD  doxycycline (VIBRAMYCIN) 100 MG capsule Take 1 capsule (100 mg total) by mouth 2 (two) times daily. One po bid x 7 days 12/29/18   Dartha Lodge, PA-C  MILK THISTLE PO Take 2 tablets by mouth 2 (two) times daily.     [provider]  oxyCODONE (ROXICODONE) 5 MG immediate release tablet Take 1 tablet (5 mg total) by mouth every 6 (six) hours as needed for severe pain or breakthrough pain. Patient not taking: Reported on 12/29/2018 01/09/18   Mack Hook, MD    Family History Family History  Problem Relation Age of Onset  . Cancer Father        lung  . Heart disease Father     Social History Social History   Tobacco Use  . Smoking status: Former Smoker    Quit date: 07/26/1978    Years since quitting: 40.9  . Smokeless tobacco: Never Used  Substance Use Topics  . Alcohol use: No  . Drug use: No     Allergies   Other   Review of Systems Review of Systems  Constitutional: Negative for activity change, appetite change and fever.  HENT: Negative for congestion and rhinorrhea.   Gastrointestinal: Positive for abdominal pain, blood in stool, nausea and vomiting.  Genitourinary: Negative for dysuria and hematuria.  Musculoskeletal: Negative for arthralgias and myalgias.  Skin: Negative for rash.  Neurological: Negative for dizziness, weakness and headaches.   all other systems are negative except as noted in the HPI and PMH.     Physical Exam Updated Vital Signs BP (!) 159/107 (BP Location: Right Arm)   Pulse 78   Temp 98.1 F (36.7 C) (Oral)   Resp 18   SpO2 100%   Physical Exam Vitals signs and nursing note reviewed.  Constitutional:      General: He is not in acute distress.    Appearance: He is well-developed.  HENT:     Head: Normocephalic and atraumatic.     Mouth/Throat:     Pharynx: No oropharyngeal exudate.  Eyes:     Conjunctiva/sclera: Conjunctivae normal.     Pupils: Pupils are equal, round, and reactive to light.  Neck:      Musculoskeletal: Normal range of motion and neck supple.     Comments: No meningismus. Cardiovascular:     Rate and Rhythm: Normal rate and regular rhythm.     Heart sounds: Normal heart sounds. No murmur.  Pulmonary:     Effort: Pulmonary effort is normal. No respiratory distress.     Breath sounds: Normal breath sounds.  Abdominal:     Palpations: Abdomen is soft.     Tenderness: There is abdominal tenderness. There is no guarding or rebound.     Comments: Epigastric and right upper quadrant tenderness, no guarding or rebound  Genitourinary:    Comments: Chaperone present, no hemorrhoids, no gross blood Musculoskeletal: Normal range  of motion.        General: No tenderness.  Skin:    General: Skin is warm.  Neurological:     Mental Status: He is alert and oriented to person, place, and time.     Cranial Nerves: No cranial nerve deficit.     Motor: No abnormal muscle tone.     Coordination: Coordination normal.     Comments: No ataxia on finger to nose bilaterally. No pronator drift. 5/5 strength throughout. CN 2-12 intact.Equal grip strength. Sensation intact.   Psychiatric:        Behavior: Behavior normal.      ED Treatments / Results  Labs (all labs ordered are listed, but only abnormal results are displayed) Labs Reviewed  CBC - Abnormal; Notable for the following components:      Result Value   RBC 4.17 (*)    Hemoglobin 12.8 (*)    All other components within normal limits  SARS CORONAVIRUS 2 (TAT 6-24 HRS)  COMPREHENSIVE METABOLIC PANEL  LIPASE, BLOOD  CBC  COMPREHENSIVE METABOLIC PANEL  POC OCCULT BLOOD, ED  POC OCCULT BLOOD, ED  TYPE AND SCREEN  ABO/RH  TROPONIN I (HIGH SENSITIVITY)  TROPONIN I (HIGH SENSITIVITY)    EKG EKG Interpretation  Date/Time:  Sunday June 10 2019 09:15:22 EST Ventricular Rate:  73 PR Interval:    QRS Duration: 102 QT Interval:  362 QTC Calculation: 399 R Axis:   68 Text Interpretation: Sinus rhythm Atrial premature  complex Minimal ST elevation, anterior leads No significant change was found Confirmed by Ezequiel Essex [phone]) on 06/10/2019 9:34:43 AM Also confirmed by Ezequiel Essex [phone]), editor Oswaldo Milian, Beverly ([id number])  on 06/10/2019 1:51:28 PM   Radiology Ct Abdomen Pelvis W Contrast  Result Date: 06/10/2019 CLINICAL DATA:  Right upper quadrant pain. Evaluate for acute cholecystitis. EXAM: CT ABDOMEN AND PELVIS WITH CONTRAST TECHNIQUE: Multidetector CT imaging of the abdomen and pelvis was performed using the standard protocol following bolus administration of intravenous contrast. CONTRAST:  111mL OMNIPAQUE IOHEXOL 300 MG/ML  SOLN COMPARISON:  Ultrasound 06/10/2019 FINDINGS: Lower chest: No acute abnormality. Hepatobiliary: No focal liver abnormality identified. Small stone within the gallbladder measures 3 mm. No gallbladder inflammation. No biliary ductal dilatation. Pancreas: Unremarkable. No pancreatic ductal dilatation or surrounding inflammatory changes. Spleen: Normal in size without focal abnormality. Adrenals/Urinary Tract: Normal appearance of the adrenal glands. There is no kidney mass or hydronephrosis. The urinary bladder is unremarkable. Stomach/Bowel: Stomach is within normal limits. Appendix appears normal. No evidence of bowel wall thickening, distention, or inflammatory changes. Vascular/Lymphatic: No significant vascular findings are present. No enlarged abdominal or pelvic lymph nodes. Reproductive: Prostate is unremarkable. Other: No free fluid or fluid collections identified. Small fat containing left inguinal hernia. Musculoskeletal: No acute or significant osseous findings. Degenerative disc disease noted at L3-4 and L4-5. IMPRESSION: 1. No acute findings within the abdomen or pelvis. 2. Gallstone. Electronically Signed   By: Kerby Moors M.D.   On: 06/10/2019 10:50   US Abdomen Limited Ruq  Result Date: 06/10/2019 CLINICAL DATA:  Right upper quadrant pain for 2 weeks. EXAM:  ULTRASOUND ABDOMEN LIMITED RIGHT UPPER QUADRANT COMPARISON:  CT of the abdomen 12/29/2018 FINDINGS: Gallbladder: Gallstone is noted measuring up to 8 mm. Mild gallbladder sludge. The gallbladder wall is upper limits of normal in thickness measuring 3 mm. Positive sonographic Murphy's sign. Common bile duct: Diameter: 5 mm Liver: Increased parenchymal echogenicity of the liver suggesting hepatic steatosis. No focal abnormality. Portal vein is patent on color  Doppler imaging with normal direction of blood flow towards the liver. Other: None. IMPRESSION: 1. Gallstone and gallbladder sludge. The gallbladder wall is mildly increased in thickness measuring 3 mm and there is a positive sonographic Murphy's sign. Acute cholecystitis cannot be excluded. If further imaging is clinically indicated hepatic biliary scan may be helpful to assess patency of the cystic duct. Electronically Signed   By: Signa Kellaylor  Stroud M.D.   On: 06/10/2019 09:43    Procedures Procedures (including critical care time)  Medications Ordered in ED Medications  pantoprazole (PROTONIX) injection 40 mg (has no administration in time range)     Initial Impression / Assessment and Plan / ED Course  I have reviewed the triage vital signs and the nursing notes.  Pertinent labs & imaging results that were available during my care of the patient were reviewed by me and considered in my medical decision making (see chart for details).       Upper abdominal pain for 3 days that is constant.  Vomiting and black stools several weeks ago but none since.  Hemoglobin stable. FOBT negative.  LFTs and lipase normal.  PPI and symptom control given. EKG unchanged. Troponin negative.  IMaging concerning for gallstones and possible cholecystitis.   D/w Dr. Dwain SarnaWakefield of surgery who will evaluate.  Final Clinical Impressions(s) / ED Diagnoses   Final diagnoses:  RUQ pain    ED Discharge Orders    None       Adir Schicker, Jeannett SeniorStephen, MD  06/10/19 1835

## 2019-06-10 NOTE — ED Notes (Addendum)
Report given to [place]

## 2019-06-10 NOTE — ED Notes (Signed)
Pt reports black stools and emesis tinged with blood after eating chicken (two weeks ago) that have since resolved. Here today for abdominal pain.

## 2019-06-10 NOTE — H&P (Signed)
Joel Lawrence is an 58 y.o. male.   Chief Complaint: abd pain HPI: 58 yom with ruq pain.  He has history of ruq pain and upper abdominal pain for some weeks.  He had episode of n/v/changes in bms at end October. This has resolved but pain has been present now for about 3 days mostly.  His bms are now normal. No more n/v he came in today due to pain that is not better with anything at home.  It is in epigastrium and ruq radiates around side.  He has history of lap repair of ventral hernia with mesh. No csc in past No chest pain for sob   Past Medical History:  Diagnosis Date  . Anginal pain (HCC)   . Anxiety    body shakes  . Chest pain   . Dysrhythmia    Palpations, when really stress  . GERD (gastroesophageal reflux disease)   . Headache(784.0)    04/05/14- bad headache this week  . HOH (hard of hearing)   . PONV (postoperative nausea and vomiting)    not last time  . Seizures (HCC)    hx of as a child no problems now     Past Surgical History:  Procedure Laterality Date  . HERNIA REPAIR  09/01/2007   Left Inguinal hernia  . HERNIA REPAIR  07/06/2010   recurrent left inguinal hernia  . INGUINAL HERNIA REPAIR  10/20/2011   Procedure: LAPAROSCOPIC INGUINAL HERNIA;  Surgeon: Wilmon ArmsMatthew K. Corliss Skainssuei, MD;  Location: [facility];  Service: General;  Laterality: Left;  recurrent  . INGUINAL HERNIA REPAIR Right 04/19/2014   Procedure: RIGHT INGUINAL HERNIA REPAIR WITH MESH;  Surgeon: Manus RuddMatthew Tsuei, MD;  Location: [facility];  Service: General;  Laterality: Right;  . INSERTION OF MESH Right 04/19/2014   Procedure: INSERTION OF MESH;  Surgeon: Manus RuddMatthew Tsuei, MD;  Location: [facility];  Service: General;  Laterality: Right;  . SHOULDER ARTHROSCOPY WITH BICEPSTENOTOMY Right 01/09/2018   Procedure: SHOULDER ARTHROSCOPY WITH BICEPSTENOTOMY;  Surgeon: Mack Hookhompson, David, MD;  Location: [facility];  Service: Orthopedics;  Laterality: Right;  . SHOULDER ARTHROSCOPY WITH ROTATOR CUFF REPAIR Right 01/09/2018   Procedure: SHOULDER ARTHROSCOPY WITH ROTATOR CUFF REPAIR,SUBACROMIAL DECOMPRESSION, BICEPS TENOTOMY;  Surgeon: Mack Hookhompson, David, MD;  Location: [facility];  Service: Orthopedics;  Laterality: Right;  . VENTRAL HERNIA REPAIR  10/20/2011   Procedure: LAPAROSCOPIC VENTRAL HERNIA;  Surgeon: Wilmon ArmsMatthew K. Corliss Skainssuei, MD;  Location: [facility];  Service: General;  Laterality: N/A;    Family History  Problem Relation Age of Onset  . Cancer Father        lung  . Heart disease Father    Social History:  reports that he quit smoking about 40 years ago. He has never used smokeless tobacco. He reports that he does not drink alcohol or use drugs.  Allergies:  Allergies  Allergen Reactions  . Other     States that some ointments and deodorant cause him to have a rash    meds reviewed  Results for orders placed or performed during the hospital encounter of 06/10/19 (from the past 48 hour(s))  Comprehensive metabolic panel     Status: None   Collection Time: 06/10/19  6:53 AM  Result Value Ref Range   Sodium 139 135 - 145 mmol/L   Potassium 4.3 3.5 - 5.1 mmol/L   Chloride 104 98 - 111 mmol/L   CO2 25 22 - 32 mmol/L   Glucose, Bld 92 70 - 99  mg/dL   BUN 14 6 - 20 mg/dL   Creatinine, Ser 1.08 0.61 - 1.24 mg/dL   Calcium 9.0 8.9 - 10.3 mg/dL   Total Protein 6.6 6.5 - 8.1 g/dL   Albumin 3.5 3.5 - 5.0 g/dL   AST 25 15 - 41 U/L   ALT 29 0 - 44 U/L   Alkaline Phosphatase 45 38 - 126 U/L   Total Bilirubin 0.5 0.3 - 1.2 mg/dL   GFR calc non Af Amer >60 >60 mL/min   GFR calc Af Amer >60 >60 mL/min   Anion gap 10 5 - 15    Comment: Performed at [facility], [place] [address], [place], [place] [address]  CBC     Status: Abnormal   Collection Time: 06/10/19  6:53 AM  Result Value Ref Range   WBC 7.7 4.0 - 10.5 K/uL   RBC 4.17 (L) 4.22 - 5.81 MIL/uL   Hemoglobin 12.8 (L) 13.0 - 17.0 g/dL   HCT 40.7 39.0 - 52.0 %   MCV 97.6 80.0 - 100.0 fL   MCH 30.7 26.0 - 34.0 pg   MCHC 31.4 30.0 - 36.0  g/dL   RDW 13.5 11.5 - 15.5 %   Platelets 383 150 - 400 K/uL   nRBC 0.0 0.0 - 0.2 %    Comment: Performed at [facility], [place] [address], [place], [place] [address]  Type and screen [place]     Status: None   Collection Time: 06/10/19  7:05 AM  Result Value Ref Range   ABO/RH(D) O POS    Antibody Screen NEG    Sample Expiration      06/13/2019,2359 Performed at [facility], [place] [address], [place], [place] [address]   ABO/Rh     Status: None   Collection Time: 06/10/19  7:05 AM  Result Value Ref Range   ABO/RH(D)      O POS Performed at [facility] [address], [place], [place] [address]   POC occult blood, ED     Status: None   Collection Time: 06/10/19  8:24 AM  Result Value Ref Range   Fecal Occult Bld NEGATIVE NEGATIVE  Troponin I (High Sensitivity)     Status: None   Collection Time: 06/10/19  9:30 AM  Result Value Ref Range   Troponin I (High Sensitivity) 9 <18 ng/L    Comment: (NOTE) Elevated high sensitivity troponin I (hsTnI) values and significant  changes across serial measurements may suggest ACS but many other  chronic and acute conditions are known to elevate hsTnI results.  Refer to the "Links" section for chest pain algorithms and additional  guidance. Performed at [facility], [place] [address], [place],  [address]   Lipase     Status: None   Collection Time: 06/10/19  9:30 AM  Result Value Ref Range   Lipase 48 11 - 51 U/L    Comment: Performed at [facility] [address], [place],  [address]  Troponin I (High Sensitivity)     Status: None   Collection Time: 06/10/19 11:30 AM  Result Value Ref Range   Troponin I (High Sensitivity) 9 <18 ng/L    Comment: (NOTE) Elevated high sensitivity troponin I (hsTnI) values and significant  changes across serial measurements may suggest ACS but many other  chronic and acute conditions are known to elevate hsTnI results.  Refer to the  "Links" section for  chest pain algorithms and additional  guidance. Performed at [facility], [address], [place], [place] [address]    Ct Abdomen Pelvis W Contrast  Result Date: 06/10/2019 CLINICAL DATA:  Right upper quadrant pain. Evaluate for acute cholecystitis. EXAM: CT ABDOMEN AND PELVIS WITH CONTRAST TECHNIQUE: Multidetector CT imaging of the abdomen and pelvis was performed using the standard protocol following bolus administration of intravenous contrast. CONTRAST:  OMNIPAQUE IOHEXOL 300 MG/ML  SOLN COMPARISON:  Ultrasound 06/10/2019 FINDINGS: Lower chest: No acute abnormality. Hepatobiliary: No focal liver abnormality identified. Small stone within the gallbladder measures 3 mm. No gallbladder inflammation. No biliary ductal dilatation. Pancreas: Unremarkable. No pancreatic ductal dilatation or surrounding inflammatory changes. Spleen: Normal in size without focal abnormality. Adrenals/Urinary Tract: Normal appearance of the adrenal glands. There is no kidney mass or hydronephrosis. The urinary bladder is unremarkable. Stomach/Bowel: Stomach is within normal limits. Appendix appears normal. No evidence of bowel wall thickening, distention, or inflammatory changes. Vascular/Lymphatic: No significant vascular findings are present. No enlarged abdominal or pelvic lymph nodes. Reproductive: Prostate is unremarkable. Other: No free fluid or fluid collections identified. Small fat containing left inguinal hernia. Musculoskeletal: No acute or significant osseous findings. Degenerative disc disease noted at L3-4 and L4-5. IMPRESSION: 1. No acute findings within the abdomen or pelvis. 2. Gallstone. Electronically Signed   By: Signa Kell M.D.   On: 06/10/2019 10:50   US Abdomen Limited Ruq  Result Date: 06/10/2019 CLINICAL DATA:  Right upper quadrant pain for 2 weeks. EXAM: ULTRASOUND ABDOMEN LIMITED RIGHT UPPER QUADRANT COMPARISON:  CT of the abdomen 12/29/2018 FINDINGS:  Gallbladder: Gallstone is noted measuring up to 8 mm. Mild gallbladder sludge. The gallbladder wall is upper limits of normal in thickness measuring 3 mm. Positive sonographic Murphy's sign. Common bile duct: Diameter: 5 mm Liver: Increased parenchymal echogenicity of the liver suggesting hepatic steatosis. No focal abnormality. Portal vein is patent on color Doppler imaging with normal direction of blood flow towards the liver. Other: None. IMPRESSION: 1. Gallstone and gallbladder sludge. The gallbladder wall is mildly increased in thickness measuring 3 mm and there is a positive sonographic Murphy's sign. Acute cholecystitis cannot be excluded. If further imaging is clinically indicated hepatic biliary scan may be helpful to assess patency of the cystic duct. Electronically Signed   By: Signa Kell M.D.   On: 06/10/2019 09:43    Review of Systems  Respiratory: Negative for shortness of breath.   Cardiovascular: Negative for chest pain.  Gastrointestinal: Positive for abdominal pain. Negative for diarrhea, nausea and vomiting.  All other systems reviewed and are negative.   Blood pressure (!) 152/89, pulse 80, temperature 98.1 F (36.7 C), temperature source Oral, resp. rate 16, SpO2 98 %. Physical Exam  Constitutional: He is oriented to person, place, and time. He appears well-developed and well-nourished. No distress.  HENT:  Head: Normocephalic and atraumatic.  Mouth/Throat: Oropharynx is clear and moist.  Eyes: Pupils are equal, round, and reactive to light. No scleral icterus.  Neck: Neck supple.  Cardiovascular: Normal rate and regular rhythm.  Respiratory: Effort normal and breath sounds normal.  GI: Soft. Bowel sounds are normal. There is abdominal tenderness in the right upper quadrant and epigastric area.  Mildly tender ruq and epigastrium  Lymphadenopathy:    He has no cervical adenopathy.  Neurological: He is alert and oriented to person, place, and time.  Skin: Skin is warm  and dry.  Psychiatric: His behavior is normal.     Assessment/Plan Cholecystitis -  He is better with pain meds but with symptoms and [place] appearance with sludge and pos sono murphys sign I think best to admit, abx, npo after midnight and plan for lap chole tomorrow -covid test pending -I discussed the procedure in detail. Risks increased due to mesh We discussed the risks and benefits of a laparoscopic cholecystectomy and possible cholangiogram including, but not limited to bleeding, infection, injury to surrounding structures such as the intestine or liver, bile leak, retained gallstones, need to convert to an open procedure, prolonged diarrhea, blood clots such as  DVT, common bile duct injury, anesthesia risks, and possible need for additional procedures.  The likelihood of improvement in symptoms and return to the patient's normal status is good. We discussed the typical post-operative recovery course.   Joel Loron, MD 06/10/2019, 12:45 PM

## 2019-06-10 NOTE — ED Triage Notes (Addendum)
Pt has been having  black tarry stools and emesis after eating at [employer] on 10/30; pt thought he had food poisoning. For the past 4 days has had increased upper and lower abd pain with continued blood in stool.

## 2019-06-10 NOTE — Progress Notes (Signed)
Pt arrived to 6n6 from ED at this time 

## 2019-06-10 NOTE — ED Notes (Signed)
Patient transported to CT 

## 2019-06-11 ENCOUNTER — Encounter (HOSPITAL_COMMUNITY): Admission: EM | Disposition: A | Discharge status: Home / Self Care | Payer: Self-pay | Source: Home / Self Care

## 2019-06-11 ENCOUNTER — Encounter (HOSPITAL_COMMUNITY): Payer: Self-pay | Admitting: Orthopedic Surgery

## 2019-06-11 ENCOUNTER — Inpatient Hospital Stay (HOSPITAL_COMMUNITY): Payer: BC Managed Care – PPO | Admitting: Certified Registered Nurse Anesthetist

## 2019-06-11 HISTORY — PX: CHOLECYSTECTOMY: SHX55

## 2019-06-11 LAB — COMPREHENSIVE METABOLIC PANEL
ALT: 22 U/L (ref 0–44)
AST: 19 U/L (ref 15–41)
Albumin: 3 g/dL — ABNORMAL LOW (ref 3.5–5.0)
Alkaline Phosphatase: 42 U/L (ref 38–126)
Anion gap: 7 (ref 5–15)
BUN: 7 mg/dL (ref 6–20)
CO2: 23 mmol/L (ref 22–32)
Calcium: 8.1 mg/dL — ABNORMAL LOW (ref 8.9–10.3)
Chloride: 107 mmol/L (ref 98–111)
Creatinine, Ser: 1.01 mg/dL (ref 0.61–1.24)
GFR calc Af Amer: >60 mL/min (ref >60)
GFR calc non Af Amer: >60 mL/min (ref >60)
Glucose, Bld: 87 mg/dL (ref 70–99)
Potassium: 3.9 mmol/L (ref 3.5–5.1)
Sodium: 137 mmol/L (ref 135–145)
Total Bilirubin: 0.4 mg/dL (ref 0.3–1.2)
Total Protein: 5.8 g/dL — ABNORMAL LOW (ref 6.5–8.1)

## 2019-06-11 LAB — CBC
HCT: 39.7 % (ref 39.0–52.0)
Hemoglobin: 12.7 g/dL — ABNORMAL LOW (ref 13.0–17.0)
MCH: 30.7 pg (ref 26.0–34.0)
MCHC: 32 g/dL (ref 30.0–36.0)
MCV: 95.9 fL (ref 80.0–100.0)
Platelets: 323 10*3/uL (ref 150–400)
RBC: 4.14 MIL/uL — ABNORMAL LOW (ref 4.22–5.81)
RDW: 13.5 % (ref 11.5–15.5)
WBC: 6 10*3/uL (ref 4.0–10.5)
nRBC: 0 % (ref 0.0–0.2)

## 2019-06-11 LAB — SURGICAL PCR SCREEN
MRSA, PCR: NEGATIVE
Staphylococcus aureus: POSITIVE — AB

## 2019-06-11 SURGERY — LAPAROSCOPIC CHOLECYSTECTOMY WITH INTRAOPERATIVE CHOLANGIOGRAM
Anesthesia: General | Site: Abdomen

## 2019-06-11 MED ORDER — FENTANYL CITRATE (PF) 100 MCG/2ML IJ SOLN: 25.0000 ug | INTRAMUSCULAR | Status: DC | PRN

## 2019-06-11 MED ORDER — OXYCODONE HCL 5 MG/5ML PO SOLN: 5.0000 mg | Freq: Once | ORAL | Status: DC | PRN

## 2019-06-11 MED ORDER — FENTANYL CITRATE (PF) 250 MCG/5ML IJ SOLN
INTRAMUSCULAR | Status: AC
  Filled 2019-06-11: qty 5

## 2019-06-11 MED ORDER — OXYCODONE HCL 5 MG PO TABS: 5.0000 mg | ORAL_TABLET | Freq: Once | ORAL | Status: DC | PRN

## 2019-06-11 MED ORDER — DIPHENHYDRAMINE HCL 50 MG/ML IJ SOLN
INTRAMUSCULAR | Status: AC
  Filled 2019-06-11: qty 1

## 2019-06-11 MED ORDER — CEFAZOLIN SODIUM-DEXTROSE 2-3 GM-%(50ML) IV SOLR
INTRAVENOUS | Status: DC | PRN
  Administered 2019-06-11: 2 g via INTRAVENOUS

## 2019-06-11 MED ORDER — FENTANYL CITRATE (PF) 250 MCG/5ML IJ SOLN
INTRAMUSCULAR | Status: DC | PRN
  Administered 2019-06-11: 50 ug via INTRAVENOUS
  Administered 2019-06-11: 100 ug via INTRAVENOUS
  Administered 2019-06-11 (×2): 50 ug via INTRAVENOUS

## 2019-06-11 MED ORDER — BUPIVACAINE-EPINEPHRINE 0.25% -1:200000 IJ SOLN
INTRAMUSCULAR | Status: DC | PRN
  Administered 2019-06-11: 20 mL

## 2019-06-11 MED ORDER — MUPIROCIN 2 % EX OINT
1.0000 "application " | TOPICAL_OINTMENT | Freq: Two times a day (BID) | CUTANEOUS | Status: DC
  Administered 2019-06-11 (×2): 1 via TOPICAL
  Filled 2019-06-11: qty 22

## 2019-06-11 MED ORDER — PROPOFOL 10 MG/ML IV BOLUS
INTRAVENOUS | Status: DC | PRN
  Administered 2019-06-11: 200 mg via INTRAVENOUS

## 2019-06-11 MED ORDER — MIDAZOLAM HCL 2 MG/2ML IJ SOLN
INTRAMUSCULAR | Status: DC | PRN
  Administered 2019-06-11: 2 mg via INTRAVENOUS

## 2019-06-11 MED ORDER — LACTATED RINGERS IV SOLN
INTRAVENOUS | Status: DC
  Administered 2019-06-11 (×2): via INTRAVENOUS

## 2019-06-11 MED ORDER — MIDAZOLAM HCL 2 MG/2ML IJ SOLN
INTRAMUSCULAR | Status: AC
  Filled 2019-06-11: qty 2

## 2019-06-11 MED ORDER — PHENYLEPHRINE 40 MCG/ML (10ML) SYRINGE FOR IV PUSH (FOR BLOOD PRESSURE SUPPORT)
PREFILLED_SYRINGE | INTRAVENOUS | Status: AC
  Filled 2019-06-11: qty 10

## 2019-06-11 MED ORDER — PHENYLEPHRINE HCL-NACL 10-0.9 MG/250ML-% IV SOLN: INTRAVENOUS | Status: DC | PRN

## 2019-06-11 MED ORDER — CEFAZOLIN SODIUM 1 G IJ SOLR
INTRAMUSCULAR | Status: AC
  Filled 2019-06-11: qty 20

## 2019-06-11 MED ORDER — DEXAMETHASONE SODIUM PHOSPHATE 10 MG/ML IJ SOLN
INTRAMUSCULAR | Status: AC
  Filled 2019-06-11: qty 1

## 2019-06-11 MED ORDER — ONDANSETRON HCL 4 MG/2ML IJ SOLN: 4.0000 mg | Freq: Four times a day (QID) | INTRAMUSCULAR | Status: DC | PRN

## 2019-06-11 MED ORDER — SUGAMMADEX SODIUM 200 MG/2ML IV SOLN
INTRAVENOUS | Status: DC | PRN
  Administered 2019-06-11: 200 mg via INTRAVENOUS

## 2019-06-11 MED ORDER — ROCURONIUM BROMIDE 10 MG/ML (PF) SYRINGE
PREFILLED_SYRINGE | INTRAVENOUS | Status: DC | PRN
  Administered 2019-06-11: 50 mg via INTRAVENOUS

## 2019-06-11 MED ORDER — SODIUM CHLORIDE (PF) 0.9 % IJ SOLN
INTRAMUSCULAR | Status: AC
  Filled 2019-06-11: qty 10

## 2019-06-11 MED ORDER — 0.9 % SODIUM CHLORIDE (POUR BTL) OPTIME
TOPICAL | Status: DC | PRN
  Administered 2019-06-11: 1000 mL

## 2019-06-11 MED ORDER — MORPHINE SULFATE (PF) 2 MG/ML IV SOLN: 2.0000 mg | INTRAVENOUS | Status: DC | PRN

## 2019-06-11 MED ORDER — ROCURONIUM BROMIDE 10 MG/ML (PF) SYRINGE
PREFILLED_SYRINGE | INTRAVENOUS | Status: AC
  Filled 2019-06-11: qty 10

## 2019-06-11 MED ORDER — ONDANSETRON HCL 4 MG/2ML IJ SOLN
INTRAMUSCULAR | Status: DC | PRN
  Administered 2019-06-11: 4 mg via INTRAVENOUS

## 2019-06-11 MED ORDER — DEXAMETHASONE SODIUM PHOSPHATE 10 MG/ML IJ SOLN
INTRAMUSCULAR | Status: DC | PRN
  Administered 2019-06-11: 5 mg via INTRAVENOUS

## 2019-06-11 MED ORDER — SODIUM CHLORIDE 0.9 % IR SOLN
Status: DC | PRN
  Administered 2019-06-11: 1

## 2019-06-11 MED ORDER — LIDOCAINE 2% (20 MG/ML) 5 ML SYRINGE
INTRAMUSCULAR | Status: DC | PRN
  Administered 2019-06-11: 50 mg via INTRAVENOUS

## 2019-06-11 MED ORDER — PHENYLEPHRINE 40 MCG/ML (10ML) SYRINGE FOR IV PUSH (FOR BLOOD PRESSURE SUPPORT): PREFILLED_SYRINGE | INTRAVENOUS | Status: DC | PRN

## 2019-06-11 MED ORDER — ONDANSETRON HCL 4 MG/2ML IJ SOLN
INTRAMUSCULAR | Status: AC
  Filled 2019-06-11: qty 2

## 2019-06-11 MED ORDER — PHENYLEPHRINE 40 MCG/ML (10ML) SYRINGE FOR IV PUSH (FOR BLOOD PRESSURE SUPPORT)
PREFILLED_SYRINGE | INTRAVENOUS | Status: DC | PRN
  Administered 2019-06-11: 120 ug via INTRAVENOUS
  Administered 2019-06-11: 160 ug via INTRAVENOUS

## 2019-06-11 MED ORDER — LIDOCAINE 2% (20 MG/ML) 5 ML SYRINGE
INTRAMUSCULAR | Status: AC
  Filled 2019-06-11: qty 5

## 2019-06-11 SURGICAL SUPPLY — 36 items
APPLIER CLIP 5 13 M/L LIGAMAX5 (MISCELLANEOUS) ×3
BLADE CLIPPER SURG (BLADE) ×3 IMPLANT
CANISTER SUCT 3000ML PPV (MISCELLANEOUS) ×3 IMPLANT
CHLORAPREP W/TINT 26 (MISCELLANEOUS) ×3 IMPLANT
CLIP APPLIE 5 13 M/L LIGAMAX5 (MISCELLANEOUS) ×1 IMPLANT
COVER MAYO STAND STRL (DRAPES) IMPLANT
COVER SURGICAL LIGHT HANDLE (MISCELLANEOUS) ×3 IMPLANT
COVER WAND RF STERILE (DRAPES) ×3 IMPLANT
DERMABOND ADVANCED (GAUZE/BANDAGES/DRESSINGS) ×2
DERMABOND ADVANCED .7 DNX12 (GAUZE/BANDAGES/DRESSINGS) ×1 IMPLANT
DRAPE C-ARM 42X120 X-RAY (DRAPES) IMPLANT
ELECT REM PT RETURN 9FT ADLT (ELECTROSURGICAL) ×3
ELECTRODE REM PT RTRN 9FT ADLT (ELECTROSURGICAL) ×1 IMPLANT
GLOVE SURG SIGNA 7.5 PF LTX (GLOVE) ×3 IMPLANT
GOWN STRL REUS W/ TWL LRG LVL3 (GOWN DISPOSABLE) ×2 IMPLANT
GOWN STRL REUS W/ TWL XL LVL3 (GOWN DISPOSABLE) ×1 IMPLANT
GOWN STRL REUS W/TWL LRG LVL3 (GOWN DISPOSABLE) ×4
GOWN STRL REUS W/TWL XL LVL3 (GOWN DISPOSABLE) ×2
KIT BASIN OR (CUSTOM PROCEDURE TRAY) ×3 IMPLANT
KIT TURNOVER KIT B (KITS) ×3 IMPLANT
NS IRRIG 1000ML POUR BTL (IV SOLUTION) ×3 IMPLANT
PAD ARMBOARD 7.5X6 YLW CONV (MISCELLANEOUS) ×3 IMPLANT
POUCH SPECIMEN RETRIEVAL 10MM (ENDOMECHANICALS) ×3 IMPLANT
SCISSORS LAP 5X35 DISP (ENDOMECHANICALS) ×3 IMPLANT
SET CHOLANGIOGRAPH 5 50 .035 (SET/KITS/TRAYS/PACK) IMPLANT
SET IRRIG TUBING LAPAROSCOPIC (IRRIGATION / IRRIGATOR) ×3 IMPLANT
SET TUBE SMOKE EVAC HIGH FLOW (TUBING) ×3 IMPLANT
SLEEVE ENDOPATH XCEL 5M (ENDOMECHANICALS) ×6 IMPLANT
SPECIMEN JAR SMALL (MISCELLANEOUS) ×3 IMPLANT
SUT MNCRL AB 4-0 PS2 18 (SUTURE) ×3 IMPLANT
TOWEL GREEN STERILE (TOWEL DISPOSABLE) ×3 IMPLANT
TOWEL GREEN STERILE FF (TOWEL DISPOSABLE) ×3 IMPLANT
TRAY LAPAROSCOPIC MC (CUSTOM PROCEDURE TRAY) ×3 IMPLANT
TROCAR XCEL BLUNT TIP 100MML (ENDOMECHANICALS) ×3 IMPLANT
TROCAR XCEL NON-BLD 5MMX100MML (ENDOMECHANICALS) ×3 IMPLANT
WATER STERILE IRR 1000ML POUR (IV SOLUTION) ×3 IMPLANT

## 2019-06-11 NOTE — Discharge Instructions (Signed)
[facility] LAPAROSCOPIC SURGERY: POST OP INSTRUCTIONS Always review your discharge instruction sheet given to you by the facility where your surgery was performed. IF YOU HAVE DISABILITY OR FAMILY LEAVE FORMS, YOU MUST BRING THEM TO THE OFFICE FOR PROCESSING.   DO NOT GIVE THEM TO YOUR DOCTOR.  PAIN CONTROL  1. First take acetaminophen (Tylenol) AND/or ibuprofen (Advil) to control your pain after surgery.  Follow directions on package.  Taking acetaminophen (Tylenol) and/or ibuprofen (Advil) regularly after surgery will help to control your pain and lower the amount of prescription pain medication you may need.  You should not take more than 3,000 mg (3 grams) of acetaminophen (Tylenol) in 24 hours.  You should not take ibuprofen (Advil), aleve, motrin, naprosyn or other NSAIDS if you have a history of stomach ulcers or chronic kidney disease.  2. A prescription for pain medication may be given to you upon discharge.  Take your pain medication as prescribed, if you still have uncontrolled pain after taking acetaminophen (Tylenol) or ibuprofen (Advil). 3. Use ice packs to help control pain. 4. If you need a refill on your pain medication, please contact your pharmacy.  They will contact our office to request authorization. Prescriptions will not be filled after 5pm or on week-ends.  HOME MEDICATIONS 5. Take your usually prescribed medications unless otherwise directed.  DIET 6. You should follow a light diet the first few days after arrival home.  Be sure to include lots of fluids daily. Avoid fatty, fried foods.   CONSTIPATION 7. It is common to experience some constipation after surgery and if you are taking pain medication.  Increasing fluid intake and taking a stool softener (such as Colace) will usually help or prevent this problem from occurring.  A mild laxative (Milk of Magnesia or Miralax) should be taken according to package instructions if there are no bowel  movements after 48 hours.  WOUND/INCISION CARE 8. Most patients will experience some swelling and bruising in the area of the incisions.  Ice packs will help.  Swelling and bruising can take several days to resolve.  9. Unless discharge instructions indicate otherwise, follow guidelines below  a. STERI-STRIPS - you may remove your outer bandages 48 hours after surgery, and you may shower at that time.  You have steri-strips (small skin tapes) in place directly over the incision.  These strips should be left on the skin for 7-10 days.   b. DERMABOND/SKIN GLUE - you may shower in 24 hours.  The glue will flake off over the next 2-3 weeks. 10. Any sutures or staples will be removed at the office during your follow-up visit.  ACTIVITIES 11. You may resume regular (light) daily activities beginning the next day--such as daily self-care, walking, climbing stairs--gradually increasing activities as tolerated.  You may have sexual intercourse when it is comfortable.  Refrain from any heavy lifting or straining until approved by your doctor. a. You may drive when you are no longer taking prescription pain medication, you can comfortably wear a seatbelt, and you can safely maneuver your car and apply brakes.  FOLLOW-UP 12. You should see your doctor in the office for a follow-up appointment approximately 2-3 weeks after your surgery.  You should have been given your post-op/follow-up appointment when your surgery was scheduled.  If you did not receive a post-op/follow-up appointment, make sure that you call for this appointment within a day or two after you arrive home to insure a convenient appointment time.     WHEN TO CALL YOUR DOCTOR: 1. Fever over 101.0 2. Inability to urinate 3. Continued bleeding from incision. 4. Increased pain, redness, or drainage from the incision. 5. Increasing abdominal pain  The clinic staff is available to answer your questions during regular business hours.  Please don't  hesitate to call and ask to speak to one of the nurses for clinical concerns.  If you have a medical emergency, go to the nearest emergency room or call 911.  A surgeon from [facility] is always on call at the hospital. [address], [address], [place], [place]  [address] ? [address] [address], [place], [place]   [address] [phone] ? [phone] ? FAX [phone] Web site: [email or web address]  .........   Managing Your Pain After Surgery Without Opioids    Thank you for participating in our program to help patients manage their pain after surgery without opioids. This is part of our effort to provide you with the best care possible, without exposing you or your family to the risk that opioids pose.  What pain can I expect after surgery? You can expect to have some pain after surgery. This is normal. The pain is typically worse the day after surgery, and quickly begins to get better. Many studies have found that many patients are able to manage their pain after surgery with Over-the-Counter (OTC) medications such as Tylenol and Motrin. If you have a condition that does not allow you to take Tylenol or Motrin, notify your surgical team.  How will I manage my pain? The best strategy for controlling your pain after surgery is around the clock pain control with Tylenol (acetaminophen) and Motrin (ibuprofen or Advil). Alternating these medications with each other allows you to maximize your pain control. In addition to Tylenol and Motrin, you can use heating pads or ice packs on your incisions to help reduce your pain.  How will I alternate your regular strength over-the-counter pain medication? You will take a dose of pain medication every three hours. ; Start by taking 650 mg of Tylenol (2 pills of 325 mg) ; 3 hours later take 600 mg of Motrin (3 pills of 200 mg) ; 3 hours after taking the Motrin take 650 mg of Tylenol ; 3 hours after that take 600 mg of  Motrin.   - 1 -  See example - if your first dose of Tylenol is at 12:00 PM   12:00 PM Tylenol 650 mg (2 pills of 325 mg)  3:00 PM Motrin 600 mg (3 pills of 200 mg)  6:00 PM Tylenol 650 mg (2 pills of 325 mg)  9:00 PM Motrin 600 mg (3 pills of 200 mg)  Continue alternating every 3 hours   We recommend that you follow this schedule around-the-clock for at least 3 days after surgery, or until you feel that it is no longer needed. Use the table on the last page of this handout to keep track of the medications you are taking. Important: Do not take more than 3000mg of Tylenol or 3200mg of Motrin in a 24-hour period. Do not take ibuprofen/Motrin if you have a history of bleeding stomach ulcers, severe kidney disease, &/or actively taking a blood thinner  What if I still have pain? If you have pain that is not controlled with the over-the-counter pain medications (Tylenol and Motrin or Advil) you might have what we call "breakthrough" pain. You will receive a prescription for a small amount of an opioid pain medication such as   Oxycodone, Tramadol, or Tylenol with Codeine. Use these opioid pills in the first 24 hours after surgery if you have breakthrough pain. Do not take more than 1 pill every 4-6 hours.  If you still have uncontrolled pain after using all opioid pills, don't hesitate to call our staff using the number provided. We will help make sure you are managing your pain in the best way possible, and if necessary, we can provide a prescription for additional pain medication.   Day 1    Time  Name of Medication Number of pills taken  Amount of Acetaminophen  Pain Level   Comments  AM PM       AM PM       AM PM       AM PM       AM PM       AM PM       AM PM       AM PM       Total Daily amount of Acetaminophen Do not take more than  3,000 mg per day      Day 2    Time  Name of Medication Number of pills taken  Amount of Acetaminophen  Pain Level   Comments  AM  PM       AM PM       AM PM       AM PM       AM PM       AM PM       AM PM       AM PM       Total Daily amount of Acetaminophen Do not take more than  3,000 mg per day      Day 3    Time  Name of Medication Number of pills taken  Amount of Acetaminophen  Pain Level   Comments  AM PM       AM PM       AM PM       AM PM          AM PM       AM PM       AM PM       AM PM       Total Daily amount of Acetaminophen Do not take more than  3,000 mg per day      Day 4    Time  Name of Medication Number of pills taken  Amount of Acetaminophen  Pain Level   Comments  AM PM       AM PM       AM PM       AM PM       AM PM       AM PM       AM PM       AM PM       Total Daily amount of Acetaminophen Do not take more than  3,000 mg per day      Day 5    Time  Name of Medication Number of pills taken  Amount of Acetaminophen  Pain Level   Comments  AM PM       AM PM       AM PM       AM PM       AM PM       AM PM       AM PM         AM PM       Total Daily amount of Acetaminophen Do not take more than  3,000 mg per day       Day 6    Time  Name of Medication Number of pills taken  Amount of Acetaminophen  Pain Level  Comments  AM PM       AM PM       AM PM       AM PM       AM PM       AM PM       AM PM       AM PM       Total Daily amount of Acetaminophen Do not take more than  3,000 mg per day      Day 7    Time  Name of Medication Number of pills taken  Amount of Acetaminophen  Pain Level   Comments  AM PM       AM PM       AM PM       AM PM       AM PM       AM PM       AM PM       AM PM       Total Daily amount of Acetaminophen Do not take more than  3,000 mg per day        For additional information about how and where to safely dispose of unused opioid medications - https://www.morepowerfulnc.org  Disclaimer: This document contains information and/or instructional materials adapted from [facility]  for the typical patient with your condition. It does not replace medical advice from your health care provider because your experience may differ from that of the typical patient. Talk to your health care provider if you have any questions about this document, your condition or your treatment plan. Adapted from [facility]  

## 2019-06-11 NOTE — Anesthesia Preprocedure Evaluation (Signed)
Anesthesia Evaluation  Patient identified by MRN, date of birth, ID band Patient awake    Reviewed: Allergy & Precautions, H&P , NPO status , Patient's Chart, lab work & pertinent test results  History of Anesthesia Complications (+) PONV and history of anesthetic complications  Airway Mallampati: II   Neck ROM: full    Dental   Pulmonary former smoker,    breath sounds clear to auscultation       Cardiovascular + angina  Rhythm:regular Rate:Normal     Neuro/Psych  Headaches, Seizures -,  PSYCHIATRIC DISORDERS Anxiety    GI/Hepatic GERD  ,  Endo/Other    Renal/GU      Musculoskeletal   Abdominal   Peds  Hematology   Anesthesia Other Findings   Reproductive/Obstetrics                             Anesthesia Physical Anesthesia Plan  ASA: II  Anesthesia Plan: General   Post-op Pain Management:    Induction: Intravenous  PONV Risk Score and Plan: 3 and Ondansetron, Dexamethasone, Treatment may vary due to age or medical condition and Midazolam  Airway Management Planned: Oral ETT  Additional Equipment:   Intra-op Plan:   Post-operative Plan: Extubation in OR  Informed Consent: I have reviewed the patients History and Physical, chart, labs and discussed the procedure including the risks, benefits and alternatives for the proposed anesthesia with the patient or authorized representative who has indicated his/her understanding and acceptance.       Plan Discussed with: CRNA, Anesthesiologist and Surgeon  Anesthesia Plan Comments:         Anesthesia Quick Evaluation

## 2019-06-11 NOTE — Progress Notes (Signed)
Patient ID: Joel Lawrence, male   DOB: 1961/05/22, 58 y.o.   MRN: [id number]  Pre Procedure note for inpatients: pt with acute cholecystitis with gallstones   Joel Lawrence has been scheduled for a lap cholecystectomy today.  I discussed the procedure in detail.    We discussed the risks and benefits of a laparoscopic cholecystectomy and possible cholangiogram including, but not limited to bleeding, infection, injury to surrounding structures such as the intestine or liver, bile leak, retained gallstones, need to convert to an open procedure, prolonged diarrhea, blood clots such as  DVT, common bile duct injury, anesthesia risks, and possible need for additional procedures.  The likelihood of improvement in symptoms and return to the patient's normal status is good. We discussed the typical post-operative recovery course.  The patient has been seen and labs reviewed. There are no changes in the patient's condition to prevent proceeding with the planned procedure today.  Recent labs:  Lab Results  Component Value Date   WBC 6.0 06/11/2019   HGB 12.7 (L) 06/11/2019   HCT 39.7 06/11/2019   PLT 323 06/11/2019   GLUCOSE 87 06/11/2019   ALT 22 06/11/2019   AST 19 06/11/2019   NA 137 06/11/2019   K 3.9 06/11/2019   CL 107 06/11/2019   CREATININE 1.01 06/11/2019   BUN 7 06/11/2019   CO2 23 06/11/2019   TSH 2.460 10/11/2016   INR 0.97 08/24/2016   HGBA1C 6.0 (H) 10/11/2016    Coralie Keens, MD 06/11/2019 7:26 AM

## 2019-06-11 NOTE — Anesthesia Procedure Notes (Signed)
Procedure Name: Intubation Date/Time: 06/11/2019 10:37 AM Performed by: Alain Marion, CRNA Pre-anesthesia Checklist: Patient identified, Emergency Drugs available, Suction available and Patient being monitored Patient Re-evaluated:Patient Re-evaluated prior to induction Oxygen Delivery Method: Circle System Utilized Preoxygenation: Pre-oxygenation with 100% oxygen Induction Type: IV induction Ventilation: Mask ventilation without difficulty Laryngoscope Size: Mac and 4 Grade View: Grade I Tube type: Oral Tube size: 7.5 mm Number of attempts: 1 Airway Equipment and Method: Stylet and Oral airway Placement Confirmation: ETT inserted through vocal cords under direct vision,  positive ETCO2 and breath sounds checked- equal and bilateral Secured at: 23 cm Tube secured with: Tape Dental Injury: Teeth and Oropharynx as per pre-operative assessment  Comments: ETT placed by EMT student, Stanton Kidney.

## 2019-06-11 NOTE — Op Note (Signed)
LAPAROSCOPIC CHOLECYSTECTOMY  Procedure Note  Joel Lawrence 06/10/2019 - 06/11/2019   Pre-op Diagnosis:acute cholecystitis with cholelithiasis     Post-op Diagnosis: same  Procedure(s): LAPAROSCOPIC CHOLECYSTECTOMY  Surgeon(s): Coralie Keens, MD   Assist: Brigid Re PA  Anesthesia: General  Staff:  Circulator: Cyd Silence, RN; Riojas, Jerome, RN Relief Scrub: Lovett Sox, CST Scrub Person: Riojas, Marita [place], RN; Small, Benjaman Lobe, RN  Estimated Blood Loss: Minimal               Specimens: sent to path  Findings: The patient was found to be mildly inflamed gallbladder with gallstones.  Multiple adhesions were easily taken down from the midline mesh bluntly.  Procedure: The patient was brought to operating identifies correct patient.  He was placed upon the operating room table general anesthesia was induced.  His abdomen was then prepped and draped in usual sterile fashion.  I made a small incision through previous scar the patient's left upper quadrant with a scalpel.  I then used the 5 mm Optiview port and camera to slowly traversed all layers of the abdominal wall and gain entrance into the abdominal cavity.  Insufflation was then begun.  I evaluated the port site and saw no evidence of bowel injury.  The patient had multiple adhesions of omentum to the previous placed midline mesh.  We placed a 5 mm trocar in the patient's right upper quadrant under direct vision.  I was then able to place another one in the epigastrium under direct vision and then took down the adhesions bluntly.  I then made incision with a scalpel and placed a 12 mm port to the right lateral side of the mesh under direct vision.  I then placed another 5 mm trocar in the patient's right upper quadrant.  The gallbladder was grasped and retracted above the liver bed.  Dissection was then carried out the base the gallbladder.  The cystic duct and cystic artery were dissected out.  I achieved a critical  window around both.  The artery was clipped first proximally distally and transected.  The cystic duct was then clipped 3 times proximally once distally and transected as well.  The gallbladder was then slowly dissected free from the liver bed with the cautery.  Once it was free from liver bed, it was placed in an Endosac and removed through the 12 mm port site.  I then irrigated the abdomen saline.  Hemostasis appeared to be achieved.  All ports were then removed under direct vision and the abdomen was deflated.  I then closed the fascia at the 12 mm port site with 0 Vicryl suture.  All incisions were then anesthetized with Marcaine and closed with 4-0 Monocryl sutures.  Dermabond was then applied.  The patient tolerated procedure well.  All the counts were correct at the end of the procedure.  The patient was then extubated in the operating room and taken in stable condition to the recovery room.          Coralie Keens   Date: 06/11/2019  Time: 11:08 AM

## 2019-06-11 NOTE — Transfer of Care (Signed)
Immediate Anesthesia Transfer of Care Note  Patient: Joel Lawrence  Procedure(s) Performed: LAPAROSCOPIC CHOLECYSTECTOMY (N/A Abdomen)  Patient Location: PACU  Anesthesia Type:General  Level of Consciousness: awake, alert  and oriented  Airway & Oxygen Therapy: Patient Spontanous Breathing and Patient connected to face mask oxygen  Post-op Assessment: Report given to RN and Post -op Vital signs reviewed and stable  Post vital signs: Reviewed and stable  Last Vitals:  Vitals Value Taken Time  BP 157/68 06/11/19 1132  Temp    Pulse 88 06/11/19 1132  Resp 21 06/11/19 1132  SpO2 94 % 06/11/19 1132  Vitals shown include unvalidated device data.  Last Pain:  Vitals:   06/11/19 0856  TempSrc: Oral  PainSc:       Patients Stated Pain Goal: 2 (12/03/00 1117)  Complications: No apparent anesthesia complications

## 2019-06-12 ENCOUNTER — Encounter (HOSPITAL_COMMUNITY): Payer: Self-pay | Admitting: Surgery

## 2019-06-12 LAB — SURGICAL PATHOLOGY

## 2019-06-12 MED ORDER — OXYCODONE HCL 5 MG PO TABS: 5.0000 mg | ORAL_TABLET | ORAL | 0 refills | Status: DC | PRN

## 2019-06-12 NOTE — Discharge Summary (Signed)
[facility]/Trauma Discharge Summary   Patient ID: Joel Lawrence MRN: [id number] DOB/AGE: [birth year] 58 y.o.  Admit date: 06/10/2019 Discharge date: 06/12/2019  Admitting Diagnosis: Cholecystitis  Discharge Diagnosis Patient Active Problem List   Diagnosis Date Noted  . Gallstones 06/10/2019  . Pre-op evaluation 04/11/2014  . GERD (gastroesophageal reflux disease) 04/11/2014  . Right inguinal hernia 03/18/2014  . Ventral hernia 09/16/2011  . Recurrent left inguinal hernia 09/16/2011    Consultants None  Imaging: Ct Abdomen Pelvis W Contrast  Result Date: 06/10/2019 CLINICAL DATA:  Right upper quadrant pain. Evaluate for acute cholecystitis. EXAM: CT ABDOMEN AND PELVIS WITH CONTRAST TECHNIQUE: Multidetector CT imaging of the abdomen and pelvis was performed using the standard protocol following bolus administration of intravenous contrast. CONTRAST:  OMNIPAQUE IOHEXOL 300 MG/ML  SOLN COMPARISON:  Ultrasound 06/10/2019 FINDINGS: Lower chest: No acute abnormality. Hepatobiliary: No focal liver abnormality identified. Small stone within the gallbladder measures 3 mm. No gallbladder inflammation. No biliary ductal dilatation. Pancreas: Unremarkable. No pancreatic ductal dilatation or surrounding inflammatory changes. Spleen: Normal in size without focal abnormality. Adrenals/Urinary Tract: Normal appearance of the adrenal glands. There is no kidney mass or hydronephrosis. The urinary bladder is unremarkable. Stomach/Bowel: Stomach is within normal limits. Appendix appears normal. No evidence of bowel wall thickening, distention, or inflammatory changes. Vascular/Lymphatic: No significant vascular findings are present. No enlarged abdominal or pelvic lymph nodes. Reproductive: Prostate is unremarkable. Other: No free fluid or fluid collections identified. Small fat containing left inguinal hernia. Musculoskeletal: No acute or significant osseous findings. Degenerative disc  disease noted at L3-4 and L4-5. IMPRESSION: 1. No acute findings within the abdomen or pelvis. 2. Gallstone. Electronically Signed   By: Signa Kell M.D.   On: 06/10/2019 10:50    Procedures Dr. Magnus Ivan (06/11/19) - Laparoscopic Cholecystectomy   HPI: 58 yom with ruq pain.  He has history of ruq pain and upper abdominal pain for some weeks.  He had episode of n/v/changes in bms at end October. This has resolved but pain has been present now for about 3 days mostly.  His bms are now normal. No more n/v he came in today due to pain that is not better with anything at home.  It is in epigastrium and ruq radiates around side.  He has history of lap repair of ventral hernia with mesh. No csc in past No chest pain for sob   Hospital Course:  Workup showed cholecystitis.  Patient was admitted and underwent procedure listed above.  Tolerated procedure well and was transferred to the floor.  Diet was advanced as tolerated.  On POD #1, the patient was voiding well, tolerating diet, ambulating well, pain well controlled, vital signs stable, incisions c/d/i and felt stable for discharge home.  Patient will follow up as outlined below and knows to call with questions or concerns.     Patient was discharged in good condition.  The [place] Substance controlled database was reviewed prior to prescribing narcotic pain medication to this patient.  Physical Exam: General:  Alert, NAD, pleasant, cooperative Cardio: RRR, S1 & S2 normal, no murmur, rubs, gallops Resp: Effort normal, lungs CTA bilaterally, no wheezes, rales, rhonchi Abd:  Soft, ND, normal bowel sounds, no tenderness, incisions with glue intact are well-appearing and are without signs of infection or bleeding.  No peritonitis. Skin: warm and dry, no rashes noted Extremities: No TTP, swelling or increased warmth to calves bilaterally  Allergies as of 06/12/2019      Reactions  Other Rash   States that some ointments and deodorant  cause him to have a rash Santyl      Medication List    STOP taking these medications   acetaminophen 325 MG tablet Commonly known as: Tylenol     TAKE these medications   aspirin EC 81 MG tablet Take 81 mg by mouth daily.   Life Pack Mens Misc Take 1 each by mouth 2 (two) times daily. Life Support-Red Yeast Rice, NAC, Hawthorn Berry, Pumpkin [employer], Milk Thistle Extract, Saw Palmetto, Grape Seed Extract, Nettle Root, Celery Seed Extract   oxyCODONE 5 MG immediate release tablet Commonly known as: Oxy IR/ROXICODONE Take 1-2 tablets (5-10 mg total) by mouth every 4 (four) hours as needed for moderate pain. What changed:   how much to take  when to take this  reasons to take this        Follow-up Information    [facility] [place]. Go on 07/03/2019.   Specialty: General Surgery Why: Follow up appointment scheduled for 8:45 AM. Please arrive 30 min prior to appointment time. Bring photo ID and insurance information.  Contact information: [address] [place] [address] [phone]           Signed: [facility] 06/12/2019, 9:12 AM Please see amion for pager for the following: M, T, W, & Friday 7:00am - 4:30pm Thursdays 7:00am -11:30am

## 2019-06-12 NOTE — Plan of Care (Signed)

## 2019-06-12 NOTE — Progress Notes (Signed)
Meridee Score to be D/C'd  per MD order. Discussed with the patient and all questions fully answered.  VSS, Skin clean, dry and intact without evidence of skin break down, no evidence of skin tears noted.  IV catheter discontinued intact. Site without signs and symptoms of complications. Dressing and pressure applied.  An After Visit Summary was printed and given to the patient. Patient received prescription.  D/c education completed with patient/family including follow up instructions, medication list, d/c activities limitations if indicated, with other d/c instructions as indicated by MD - patient able to verbalize understanding, all questions fully answered.   Patient instructed to return to ED, call 911, or call MD for any changes in condition.   Patient to be escorted via [place], and D/C home via private auto.

## 2019-06-12 NOTE — Anesthesia Postprocedure Evaluation (Signed)
Anesthesia Post Note  Patient: Joel Lawrence  Procedure(s) Performed: LAPAROSCOPIC CHOLECYSTECTOMY (N/A Abdomen)     Patient location during evaluation: PACU Anesthesia Type: General Level of consciousness: awake and alert Pain management: pain level controlled Vital Signs Assessment: post-procedure vital signs reviewed and stable Respiratory status: spontaneous breathing, nonlabored ventilation, respiratory function stable and patient connected to nasal cannula oxygen Cardiovascular status: blood pressure returned to baseline and stable Postop Assessment: no apparent nausea or vomiting Anesthetic complications: no    Last Vitals:  Vitals:   06/12/19 0144 06/12/19 0605  BP: 111/67 115/69  Pulse: 73 77  Resp: 16 17  Temp: 36.9 C 36.8 C  SpO2: 94% 100%    Last Pain:  Vitals:   06/12/19 0821  TempSrc:   PainSc: 0-No pain                 Anabeth Chilcott S

## 2020-03-16 ENCOUNTER — Emergency Department (HOSPITAL_COMMUNITY): Payer: No Typology Code available for payment source

## 2020-03-16 ENCOUNTER — Encounter (HOSPITAL_COMMUNITY): Payer: Self-pay | Admitting: Emergency Medicine

## 2020-03-16 ENCOUNTER — Other Ambulatory Visit: Payer: Self-pay

## 2020-03-16 ENCOUNTER — Emergency Department (HOSPITAL_COMMUNITY)
Admission: EM | Admit: 2020-03-16 | Discharge: 2020-03-16 | Disposition: A | Discharge status: Home / Self Care | Payer: No Typology Code available for payment source | Attending: Emergency Medicine | Admitting: Emergency Medicine

## 2020-03-16 DIAGNOSIS — X500XXA Overexertion from strenuous movement or load, initial encounter: Secondary | ICD-10-CM | POA: Diagnosis not present

## 2020-03-16 DIAGNOSIS — Y999 Unspecified external cause status: Secondary | ICD-10-CM | POA: Diagnosis not present

## 2020-03-16 DIAGNOSIS — M25511 Pain in right shoulder: Secondary | ICD-10-CM

## 2020-03-16 DIAGNOSIS — Y929 Unspecified place or not applicable: Secondary | ICD-10-CM | POA: Diagnosis not present

## 2020-03-16 DIAGNOSIS — Y939 Activity, unspecified: Secondary | ICD-10-CM | POA: Diagnosis not present

## 2020-03-16 DIAGNOSIS — Z87891 Personal history of nicotine dependence: Secondary | ICD-10-CM | POA: Insufficient documentation

## 2020-03-16 MED ORDER — HYDROCODONE-ACETAMINOPHEN 5-325 MG PO TABS: 1.0000 | ORAL_TABLET | ORAL | 0 refills | Status: DC | PRN

## 2020-03-16 NOTE — ED Provider Notes (Addendum)
[facility]-EMERGENCY DEPT Provider Note   CSN: [id number] Arrival date & time: 03/16/20  1335     History Chief Complaint  Patient presents with  . Shoulder Pain    right    Joel Lawrence is a 59 y.o. male history of GERD, seizures, angina, headaches, inguinal hernia repair, cholecystectomy, arthroscopic shoulder repair.   Patient presents today for right shoulder pain that occurred this morning while working at [employer].  He works on [employer], he was attempting to push a heavy box from one line onto another, the box got caught on a small metal ridge while sliding, this caused patient to jam his shoulder on the box.  He reports sudden onset anterior right shoulder pain severe constant throbbing worsened with movement improved with time.  Patient reports similar pain in the past with rotator cuff injury.  Denies head injury, chest pain, shortness of breath, neck pain, back pain, numbness/tingling, weakness, swelling/color change or any additional concerns. HPI     Past Medical History:  Diagnosis Date  . Anginal pain (HCC)   . Anxiety    body shakes  . Chest pain   . Dysrhythmia    Palpations, when really stress  . GERD (gastroesophageal reflux disease)   . Headache(784.0)    04/05/14- bad headache this week  . HOH (hard of hearing)   . PONV (postoperative nausea and vomiting)    not last time  . Seizures (HCC)    hx of as a child no problems now     Patient Active Problem List   Diagnosis Date Noted  . Gallstones 06/10/2019  . Pre-op evaluation 04/11/2014  . GERD (gastroesophageal reflux disease) 04/11/2014  . Right inguinal hernia 03/18/2014  . Ventral hernia 09/16/2011  . Recurrent left inguinal hernia 09/16/2011    Past Surgical History:  Procedure Laterality Date  . CHOLECYSTECTOMY N/A 06/11/2019   Procedure: LAPAROSCOPIC CHOLECYSTECTOMY;  Surgeon: Abigail Miyamoto, MD;  Location: [facility] OR;  Service: General;  Laterality: N/A;  . HERNIA  REPAIR  09/01/2007   Left Inguinal hernia  . HERNIA REPAIR  07/06/2010   recurrent left inguinal hernia  . INGUINAL HERNIA REPAIR  10/20/2011   Procedure: LAPAROSCOPIC INGUINAL HERNIA;  Surgeon: Wilmon Arms. Corliss Skains, MD;  Location: [facility] ORS;  Service: General;  Laterality: Left;  recurrent  . INGUINAL HERNIA REPAIR Right 04/19/2014   Procedure: RIGHT INGUINAL HERNIA REPAIR WITH MESH;  Surgeon: Manus Rudd, MD;  Location: [facility] OR;  Service: General;  Laterality: Right;  . INSERTION OF MESH Right 04/19/2014   Procedure: INSERTION OF MESH;  Surgeon: Manus Rudd, MD;  Location: [facility] OR;  Service: General;  Laterality: Right;  . SHOULDER ARTHROSCOPY WITH BICEPSTENOTOMY Right 01/09/2018   Procedure: SHOULDER ARTHROSCOPY WITH BICEPSTENOTOMY;  Surgeon: Mack Hook, MD;  Location: [facility];  Service: Orthopedics;  Laterality: Right;  . SHOULDER ARTHROSCOPY WITH ROTATOR CUFF REPAIR Right 01/09/2018   Procedure: SHOULDER ARTHROSCOPY WITH ROTATOR CUFF REPAIR,SUBACROMIAL DECOMPRESSION, BICEPS TENOTOMY;  Surgeon: Mack Hook, MD;  Location: [facility];  Service: Orthopedics;  Laterality: Right;  . VENTRAL HERNIA REPAIR  10/20/2011   Procedure: LAPAROSCOPIC VENTRAL HERNIA;  Surgeon: Wilmon Arms. Corliss Skains, MD;  Location: [facility];  Service: General;  Laterality: N/A;       Family History  Problem Relation Age of Onset  . Cancer Father        lung  . Heart disease Father     Social History   Tobacco Use  .  Smoking status: Former Smoker    Quit date: 07/26/1978    Years since quitting: 41.6  . Smokeless tobacco: Never Used  Substance Use Topics  . Alcohol use: No  . Drug use: No    Home Medications Prior to Admission medications   Medication Sig Start Date End Date Taking? Authorizing Provider  aspirin EC 81 MG tablet Take 81 mg by mouth daily.    [provider]  HYDROcodone-acetaminophen (NORCO/VICODIN) 5-325 MG tablet Take 1 tablet by mouth every 4 (four) hours  as needed. 03/16/20   Harlene SaltsMorelli, Zana Biancardi A, PA-C  Multiple Vitamins-Minerals (LIFE PACK MENS) MISC Take 1 each by mouth 2 (two) times daily. Life Support-Red Yeast Rice, NAC, Hawthorn Berry, Pumpkin [employer] CorporationSeed Extract, Milk Thistle Extract, Saw Palmetto, Grape Seed Extract, Nettle Root, Celery Seed Extract    [provider]    Allergies    Other  Review of Systems   Review of Systems  Constitutional: Negative.  Negative for chills and fever.  Respiratory: Negative.  Negative for shortness of breath.   Cardiovascular: Negative.  Negative for chest pain.  Musculoskeletal: Positive for arthralgias (Right shoulder). Negative for back pain and neck pain.  Neurological: Negative for weakness, numbness and headaches.    Physical Exam Updated Vital Signs BP (!) 152/87 (BP Location: Left Arm)   Pulse 99   Temp 98.2 F (36.8 C) (Oral)   Resp 18   Ht 6' (1.829 m)   Wt 99.8 kg   SpO2 95%   BMI 29.84 kg/m   Physical Exam Constitutional:      General: He is not in acute distress.    Appearance: Normal appearance. He is well-developed. He is not ill-appearing or diaphoretic.  HENT:     Head: Normocephalic and atraumatic.  Eyes:     General: Vision grossly intact. Gaze aligned appropriately.     Pupils: Pupils are equal, round, and reactive to light.  Neck:     Trachea: Trachea and phonation normal.  Cardiovascular:     Rate and Rhythm: Normal rate and regular rhythm.     Pulses:          Radial pulses are 2+ on the right side and 2+ on the left side.  Pulmonary:     Effort: Pulmonary effort is normal. No respiratory distress.  Abdominal:     General: There is no distension.     Palpations: Abdomen is soft.     Tenderness: There is no abdominal tenderness. There is no guarding or rebound.  Musculoskeletal:        General: Normal range of motion.     Cervical back: Normal range of motion.     Comments: Cervical Spine: Appearance normal. No obvious bony deformity. No skin  swelling, erythema, heat, fluctuance or break of the skin. No TTP over the cervical spinous processes. No paraspinal tenderness. No step-offs. Patient is able to actively rotate their neck 45 degrees left and right voluntarily without pain and flex and extend the neck without pain. - Right Shoulder: Appearance normal.  Well-healed surgical scars from previous arthroscopic repair.  No obvious bony deformity. No skin swelling, erythema, heat, fluctuance or break of the skin. No clavicular deformity or TTP. TTP over anterior deltoid.  Patient with minimal active range of motion below shoulder level without pain.  Active range of motion limited secondary to pain.  Passive range of motion intact up to shoulder level without pain.  Passive internal and external rotation abduction abduction flexion extension intact  with minimal pain. - Right Elbow: Appearance normal. No obvious bony deformity. No skin swelling, erythema, heat, fluctuance or break of the skin. No TTP over joint. Active flexion, extension, supination and pronation full and intact without pain. Strength able and appropriate for age for flexion and extension.  Radial Pulse 2+. Cap refill <2 seconds. SILT for M/U/R distributions. Compartments soft.   Skin:    General: Skin is warm and dry.  Neurological:     Mental Status: He is alert.     GCS: GCS eye subscore is 4. GCS verbal subscore is 5. GCS motor subscore is 6.     Comments: Speech is clear and goal oriented, follows commands Major Cranial nerves without deficit, no facial droop Moves extremities without ataxia, coordination intact. Strong and equal grip strength  Psychiatric:        Behavior: Behavior normal.     ED Results / Procedures / Treatments   Labs (all labs ordered are listed, but only abnormal results are displayed) Labs Reviewed - No data to display  EKG None  Radiology DG Shoulder Right  Result Date: 03/16/2020 CLINICAL DATA:  Acute shoulder pain following  injury. Initial encounter. History of prior rotator cuff repair. EXAM: RIGHT SHOULDER - 2+ VIEW COMPARISON:  None. FINDINGS: No acute fracture, subluxation or dislocation identified. Humeral head degenerative/cystic changes at the rotator cuff insertion noted. Mild degenerative changes at the [facility] joint are present. No focal bony lesions are present. IMPRESSION: 1. No evidence of acute abnormality. 2. Degenerative changes as described. Electronically Signed   By: Harmon Pier M.D.   On: 03/16/2020 14:23    Procedures Procedures (including critical care time)  Medications Ordered in ED Medications - No data to display  ED Course  I have reviewed the triage vital signs and the nursing notes.  Pertinent labs & imaging results that were available during my care of the patient were reviewed by me and considered in my medical decision making (see chart for details).    MDM Rules/Calculators/A&P                         Additional history obtained from: 1. Nursing notes from this visit. -------------- DG Right Shoulder:  IMPRESSION:  1. No evidence of acute abnormality.  2. Degenerative changes as described.  - History and examination concerning for likely rotator cuff injury.  X-ray and examination not suggestive of fracture or dislocation.  No evidence of septic arthritis, cellulitis, DVT, compartment syndrome, neurovascular compromise or other emergent pathologies at this time.  Patient placed in sling and referred to orthopedic for further evaluation and treatment.  Patient states understanding of limitations of x-rays today and need for follow-up evaluation and possible future imaging.  Patient informed of degenerative changes seen on x-ray.  PMD P reviewed patient without recent opioid prescriptions, last prescription was in 2020.  Feel reasonable to prescribe patient 5 pills of Norco for suspected rotator cuff tear.  We discussed narcotic precautions patient states understanding.  Patient also  aware not to take Norco with other narcotic medications.  His most recent narcotic prescription was for 2 pills of diazepam in June of this year.  Patient also use OTC anti-inflammatories and rice therapy.  At this time there does not appear to be any evidence of an acute emergency medical condition and the patient appears stable for discharge with appropriate outpatient follow up. Diagnosis was discussed with patient who verbalizes understanding of care plan and is  agreeable to discharge. I have discussed return precautions with patient who verbalizes understanding. Patient encouraged to follow-up with their PCP and ortho. All questions answered.  Note: Portions of this report may have been transcribed using voice recognition software. Every effort was made to ensure accuracy; however, inadvertent computerized transcription errors may still be present. Final Clinical Impression(s) / ED Diagnoses Final diagnoses:  Acute pain of right shoulder    Rx / DC Orders ED Discharge Orders         Ordered    HYDROcodone-acetaminophen (NORCO/VICODIN) 5-325 MG tablet  Every 4 hours PRN        03/16/20 1525           Bill Salinas, PA-C 03/16/20 1532    Elizabeth Palau 03/16/20 1533    Cathren Laine, MD 03/16/20 1558

## 2020-03-16 NOTE — Discharge Instructions (Addendum)
At this time there does not appear to be the presence of an emergent medical condition, however there is always the potential for conditions to change. Please read and follow the below instructions.  Please return to the Emergency Department immediately for any new or worsening symptoms. Please be sure to follow up with your Primary Care Provider within one week regarding your visit today; please call their office to schedule an appointment even if you are feeling better for a follow-up visit. You may take the medication Norco (Hydrocodone/Acetaminophen) as prescribed to help with severe pain.  This medication will make you drowsy so do not drive, drink alcohol, take other sedating medications or perform any dangerous activities such as driving after taking Norco. Norco contains Tylenol (acetaminophen) so do not take any other Tylenol-containing products with Norco. Continue using the sling provided to you today while moving around to protect your shoulder from further injury.  Please call your orthopedic specialist for further evaluation and treatment of your shoulder pain.  If you are unable to contact your previous orthopedic specialist you may call Dr. Susa Simmonds on your discharge paperwork to schedule a follow-up appointment.  Get help right away if: Your arm, hand, or fingers: Tingle. Are numb. Are swollen. Are painful. Turn white or blue. You have fever or chills You have any new/concerning or worsening of symptoms.  Please read the additional information packets attached to your discharge summary.  Do not take your medicine if  develop an itchy rash, swelling in your mouth or lips, or difficulty breathing; call 911 and seek immediate emergency medical attention if this occurs.  You may review your lab tests and imaging results in their entirety on your MyChart account.  Please discuss all results of fully with your primary care provider and other specialist at your follow-up visit.  Note:  Portions of this text may have been transcribed using voice recognition software. Every effort was made to ensure accuracy; however, inadvertent computerized transcription errors may still be present.

## 2020-03-16 NOTE — ED Triage Notes (Signed)
Pt works at [employer] and was pushing a box on belt. Reports having right shoulder pains and cant hold or pick up anything with that arm.

## 2021-11-22 ENCOUNTER — Emergency Department (HOSPITAL_BASED_OUTPATIENT_CLINIC_OR_DEPARTMENT_OTHER)
Admission: EM | Admit: 2021-11-22 | Discharge: 2021-11-22 | Disposition: A | Discharge status: Home / Self Care | Payer: No Typology Code available for payment source | Attending: Emergency Medicine | Admitting: Emergency Medicine

## 2021-11-22 ENCOUNTER — Encounter (HOSPITAL_BASED_OUTPATIENT_CLINIC_OR_DEPARTMENT_OTHER): Payer: Self-pay | Admitting: Emergency Medicine

## 2021-11-22 ENCOUNTER — Emergency Department (HOSPITAL_BASED_OUTPATIENT_CLINIC_OR_DEPARTMENT_OTHER): Payer: No Typology Code available for payment source

## 2021-11-22 ENCOUNTER — Other Ambulatory Visit: Payer: Self-pay

## 2021-11-22 DIAGNOSIS — M25512 Pain in left shoulder: Secondary | ICD-10-CM | POA: Insufficient documentation

## 2021-11-22 DIAGNOSIS — S60311A Abrasion of right thumb, initial encounter: Secondary | ICD-10-CM | POA: Diagnosis not present

## 2021-11-22 DIAGNOSIS — Z7982 Long term (current) use of aspirin: Secondary | ICD-10-CM | POA: Diagnosis not present

## 2021-11-22 DIAGNOSIS — M25531 Pain in right wrist: Secondary | ICD-10-CM | POA: Diagnosis not present

## 2021-11-22 DIAGNOSIS — M25532 Pain in left wrist: Secondary | ICD-10-CM | POA: Diagnosis not present

## 2021-11-22 DIAGNOSIS — S60931A Unspecified superficial injury of right thumb, initial encounter: Secondary | ICD-10-CM | POA: Diagnosis present

## 2021-11-22 DIAGNOSIS — W010XXA Fall on same level from slipping, tripping and stumbling without subsequent striking against object, initial encounter: Secondary | ICD-10-CM | POA: Diagnosis not present

## 2021-11-22 DIAGNOSIS — W19XXXA Unspecified fall, initial encounter: Secondary | ICD-10-CM

## 2021-11-22 MED ORDER — IBUPROFEN 400 MG PO TABS
600.0000 mg | ORAL_TABLET | Freq: Once | ORAL | Status: AC
  Administered 2021-11-22: 600 mg via ORAL
  Filled 2021-11-22: qty 1

## 2021-11-22 MED ORDER — ACETAMINOPHEN 325 MG PO TABS
650.0000 mg | ORAL_TABLET | Freq: Once | ORAL | Status: AC
  Administered 2021-11-22: 650 mg via ORAL
  Filled 2021-11-22: qty 2

## 2021-11-22 MED ORDER — BACITRACIN ZINC 500 UNIT/GM EX OINT
TOPICAL_OINTMENT | Freq: Two times a day (BID) | CUTANEOUS | Status: DC
  Filled 2021-11-22: qty 28.35

## 2021-11-22 MED ORDER — OXYCODONE-ACETAMINOPHEN 5-325 MG PO TABS: 1.0000 | ORAL_TABLET | Freq: Four times a day (QID) | ORAL | 0 refills | Status: AC | PRN

## 2021-11-22 NOTE — ED Triage Notes (Signed)
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 Accident about 0415 this am, no LOC, pain to bil wrist, left shoulder and neck  ?

## 2021-11-22 NOTE — ED Notes (Signed)
ED Provider at bedside. 

## 2021-11-22 NOTE — Discharge Instructions (Addendum)
Keep the splint and brace on. ? ?Follow-up with your Worker's Comp. specialist within the next 2 days.  Take the medication as written for pain.  Return back to the ER if you have worsening symptoms or any additional concerns. ?

## 2021-11-22 NOTE — ED Provider Notes (Signed)
?[facility] EMERGENCY DEPARTMENT ?Provider Note ? ? ?CSN: [place]:[id number] ?Arrival date & time: 11/22/21  [id number] ? ?  ? ?History ? ?Chief Complaint  ?Patient presents with  ? Fall  ? ? ?Joel Lawrence is a 61 y.o. male. ? ?Patient presented chief complaint of left shoulder pain bilateral wrist pain.  He states that he slipped and fell while at work.  He works as a Presenter, broadcasting.  He states it was raining and appeared slippery and he slipped and fell onto his left shoulder.  Denies head injury or loss of consciousness.  Denies neck pain or back pain.  Denies any lower extremity pain.  No reported recent fevers or cough or vomiting or diarrhea. ? ? ?  ? ?Home Medications ?Prior to Admission medications   ?Medication Sig Start Date End Date Taking? Authorizing Provider  ?aspirin EC 81 MG tablet Take 81 mg by mouth daily.    [provider]  ?HYDROcodone-acetaminophen (NORCO/VICODIN) 5-325 MG tablet Take 1 tablet by mouth every 4 (four) hours as needed. 03/16/20   Deliah Boston, PA-C  ?Multiple Vitamins-Minerals (LIFE PACK MENS) MISC Take 1 each by mouth 2 (two) times daily. Life Support-Red Yeast Rice, NAC, Hawthorn Berry, Pumpkin [employer], Milk Thistle Extract, Saw Palmetto, Grape Seed Extract, Nettle Root, Celery Seed Extract    [provider]  ?   ? ?Allergies    ?Other   ? ?Review of Systems   ?Review of Systems  ?Constitutional:  Negative for fever.  ?HENT:  Negative for ear pain and sore throat.   ?Eyes:  Negative for pain.  ?Respiratory:  Negative for cough.   ?Cardiovascular:  Negative for chest pain.  ?Gastrointestinal:  Negative for abdominal pain.  ?Genitourinary:  Negative for flank pain.  ?Musculoskeletal:  Negative for back pain.  ?Skin:  Negative for color change and rash.  ?Neurological:  Negative for syncope.  ?All other systems reviewed and are negative. ? ?Physical Exam ?Updated Vital Signs ?BP 130/78   Pulse 82   Temp 98 ?F (36.7 ?C) (Oral)   Resp 18   Ht 5\' 11"   (1.803 m)   Wt 109.8 kg   SpO2 95%   BMI 33.75 kg/m?  ?Physical Exam ?Constitutional:   ?   Appearance: He is well-developed.  ?HENT:  ?   Head: Normocephalic.  ?   Nose: Nose normal.  ?Eyes:  ?   Extraocular Movements: Extraocular movements intact.  ?Cardiovascular:  ?   Rate and Rhythm: Normal rate.  ?Pulmonary:  ?   Effort: Pulmonary effort is normal.  ?Skin: ?   Coloration: Skin is not jaundiced.  ?   Comments: Abrasions to the right thumb and to the base of the palms bilaterally.  Some skin tears noted here but nothing that can be repaired with sutures.  ?Neurological:  ?   Mental Status: He is alert. Mental status is at baseline.  ? ? ?ED Results / Procedures / Treatments   ?Labs ?(all labs ordered are listed, but only abnormal results are displayed) ?Labs Reviewed - No data to display ? ?EKG ?None ? ?Radiology ?DG Elbow Complete Left ? ?Result Date: 11/22/2021 ?CLINICAL DATA:  Fall, left elbow pain EXAM: LEFT ELBOW - COMPLETE 3+ VIEW COMPARISON:  None. FINDINGS: There is no evidence of fracture, dislocation, or joint effusion. Mild degenerative spurring along the medial margin of the ulnotrochlear joint. Slight enthesopathic changes at the medial humeral epicondyle. Soft tissues are unremarkable. IMPRESSION: Negative. Electronically Signed  By: Davina Poke D.O.   On: 11/22/2021 09:31  ? ?DG Wrist Complete Left ? ?Result Date: 11/22/2021 ?CLINICAL DATA:  Fall.  Left wrist pain EXAM: LEFT WRIST - COMPLETE 3+ VIEW COMPARISON:  None. FINDINGS: There is no evidence of acute fracture or dislocation. Well corticated osseous density adjacent to the tip of the ulnar styloid, may reflect unfused ossification center or sequela of remote trauma. Widening of the scapholunate interval. No proximal migration of the capitate. There is no evidence of arthropathy or other focal bone abnormality. Soft tissues are unremarkable. IMPRESSION: 1. No acute fracture or dislocation of the left wrist. 2. Widening of the  scapholunate interval compatible with underlying scapholunate ligament injury, age indeterminate. Electronically Signed   By: Davina Poke D.O.   On: 11/22/2021 09:29  ? ?DG Wrist Complete Right ? ?Result Date: 11/22/2021 ?CLINICAL DATA:  Fall.  Right hand and wrist pain EXAM: RIGHT WRIST - COMPLETE 3+ VIEW; RIGHT HAND - COMPLETE 3+ VIEW COMPARISON:  None. FINDINGS: No evidence of acute fracture. Chronic appearing fragmentation of the terminal tuft of the right thumb distal phalanx. No malalignment. Joint spaces of the right hand and wrist are relatively well maintained. No focal soft tissue swelling. IMPRESSION: 1. No evidence of acute fracture or dislocation of the right hand or wrist. 2. Chronic appearing fragmentation of the terminal tuft of the right thumb distal phalanx. Electronically Signed   By: Davina Poke D.O.   On: 11/22/2021 09:28  ? ?DG Shoulder Left ? ?Result Date: 11/22/2021 ?CLINICAL DATA:  Fall, left shoulder pain EXAM: LEFT SHOULDER - 2+ VIEW COMPARISON:  None. FINDINGS: There is no evidence of fracture or dislocation. 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 Soft tissues are unremarkable. IMPRESSION: Negative. Electronically Signed   By: Davina Poke D.O.   On: 11/22/2021 09:30  ? ?DG Hand Complete Right ? ?Result Date: 11/22/2021 ?CLINICAL DATA:  Fall.  Right hand and wrist pain EXAM: RIGHT WRIST - COMPLETE 3+ VIEW; RIGHT HAND - COMPLETE 3+ VIEW COMPARISON:  None. FINDINGS: No evidence of acute fracture. Chronic appearing fragmentation of the terminal tuft of the right thumb distal phalanx. No malalignment. Joint spaces of the right hand and wrist are relatively well maintained. No focal soft tissue swelling. IMPRESSION: 1. No evidence of acute fracture or dislocation of the right hand or wrist. 2. Chronic appearing fragmentation of the terminal tuft of the right thumb distal phalanx. Electronically Signed   By: Davina Poke D.O.   On: 11/22/2021  09:28   ? ?Procedures ?[facility] Injury Treatment ? ?Date/Time: 11/22/2021 10:01 AM ?Performed by: Luna Fuse, MD ?Authorized by: Luna Fuse, MD  ?Post-procedure neurovascular assessment: post-procedure neurovascularly intact ?Comments: Splint to left upper extremity, neurovascular intact after placement. ? ?  ? ? ?Medications Ordered in ED ?Medications  ?bacitracin ointment (0000000 application. Topical Incomplete 11/22/21 0906)  ?acetaminophen (TYLENOL) tablet 650 mg (650 mg Oral Given 11/22/21 0859)  ?ibuprofen (ADVIL) tablet 600 mg (600 mg Oral Given 11/22/21 0859)  ? ? ?ED Course/ Medical Decision Making/ A&P ?  ?                        ?Medical Decision Making ?Amount and/or Complexity of Data Reviewed ?Radiology: ordered. ? ?Risk ?OTC drugs. ? ? ?Patient on cardiac monitor, sinus rhythm noted. ? ?Outpatient office visit with him as noted in August 2022 for rash. ? ?Patient states that he would be driving himself, given  Tylenol Motrin for symptom management. ? ?Multiple x-rays ordered.  No acute fracture noted.  Some widening of the scapholunate bones noted in the left wrist.  Patient placed in a left wrist splint, neurovascular intact after placement. ? ?Left shoulder immobilizer ordered as well which the patient declined. ? ?We will recommend outpatient follow-up with his Worker's Comp. physician within the week.  Recommending Tylenol Motrin at home.  Recommending immediate return for worsening pain or any additional concerns. ? ? ? ? ? ? ? ?Final Clinical Impression(s) / ED Diagnoses ?Final diagnoses:  ?Fall, initial encounter  ?Acute pain of left shoulder  ?Bilateral wrist pain  ? ? ?Rx / DC Orders ?ED Discharge Orders   ? ? None  ? ?  ? ? ?  ?Luna Fuse, MD ?11/22/21 1003 ? ?

## 2021-11-22 NOTE — ED Notes (Signed)
Pt refused shoulder immobilizer due to pain. EDP and RN made aware.  ?

## 2023-09-07 IMAGING — DX DG WRIST COMPLETE 3+V*L*
4 series · 4 of 4 positions shown · non-contrast
Comparison: None.

CLINICAL DATA: Fall.  Left wrist pain

EXAM:
LEFT WRIST - COMPLETE 3+ VIEW

[wrist pa]
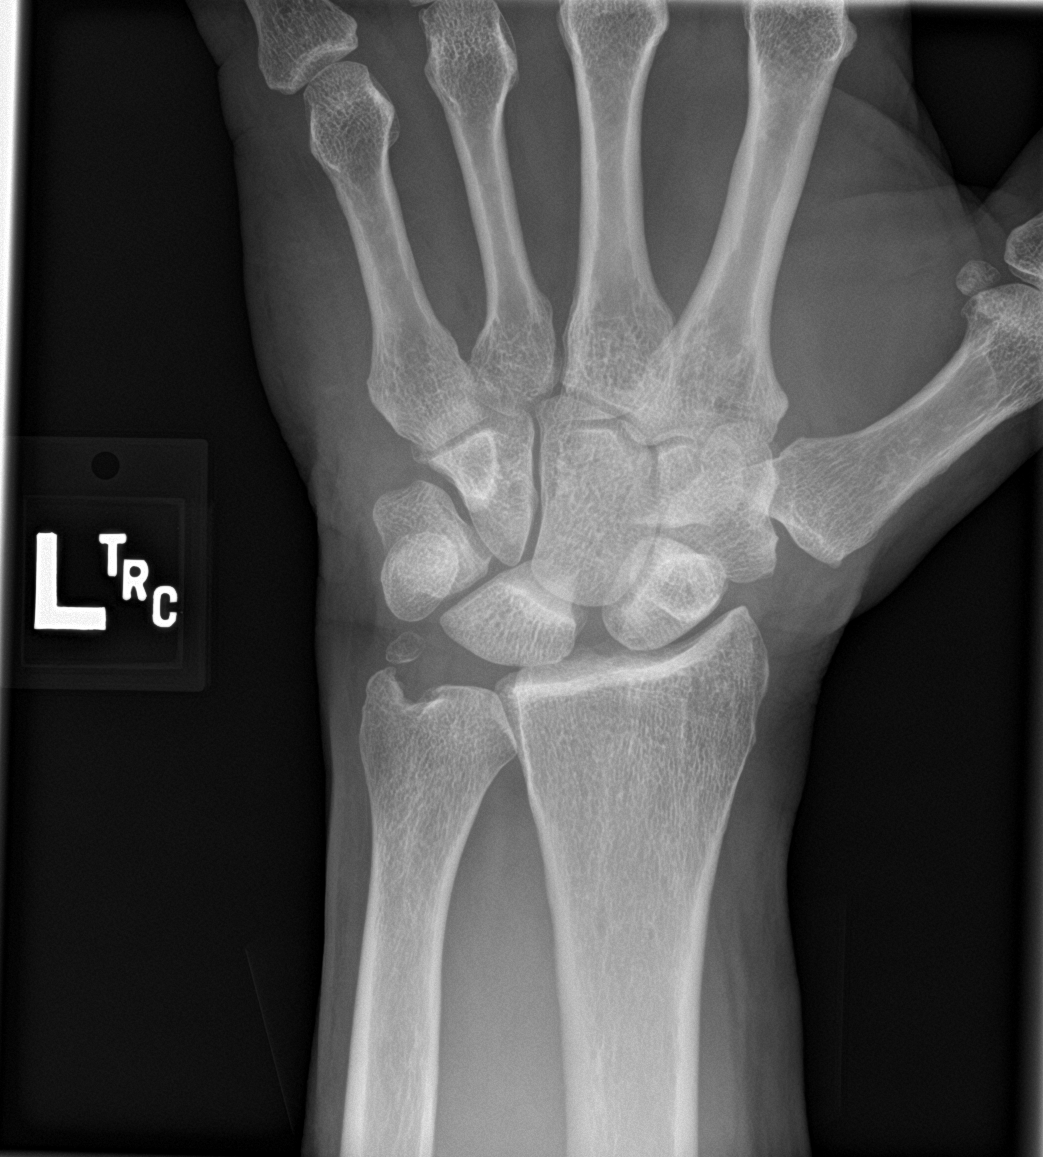

[wrist obl]
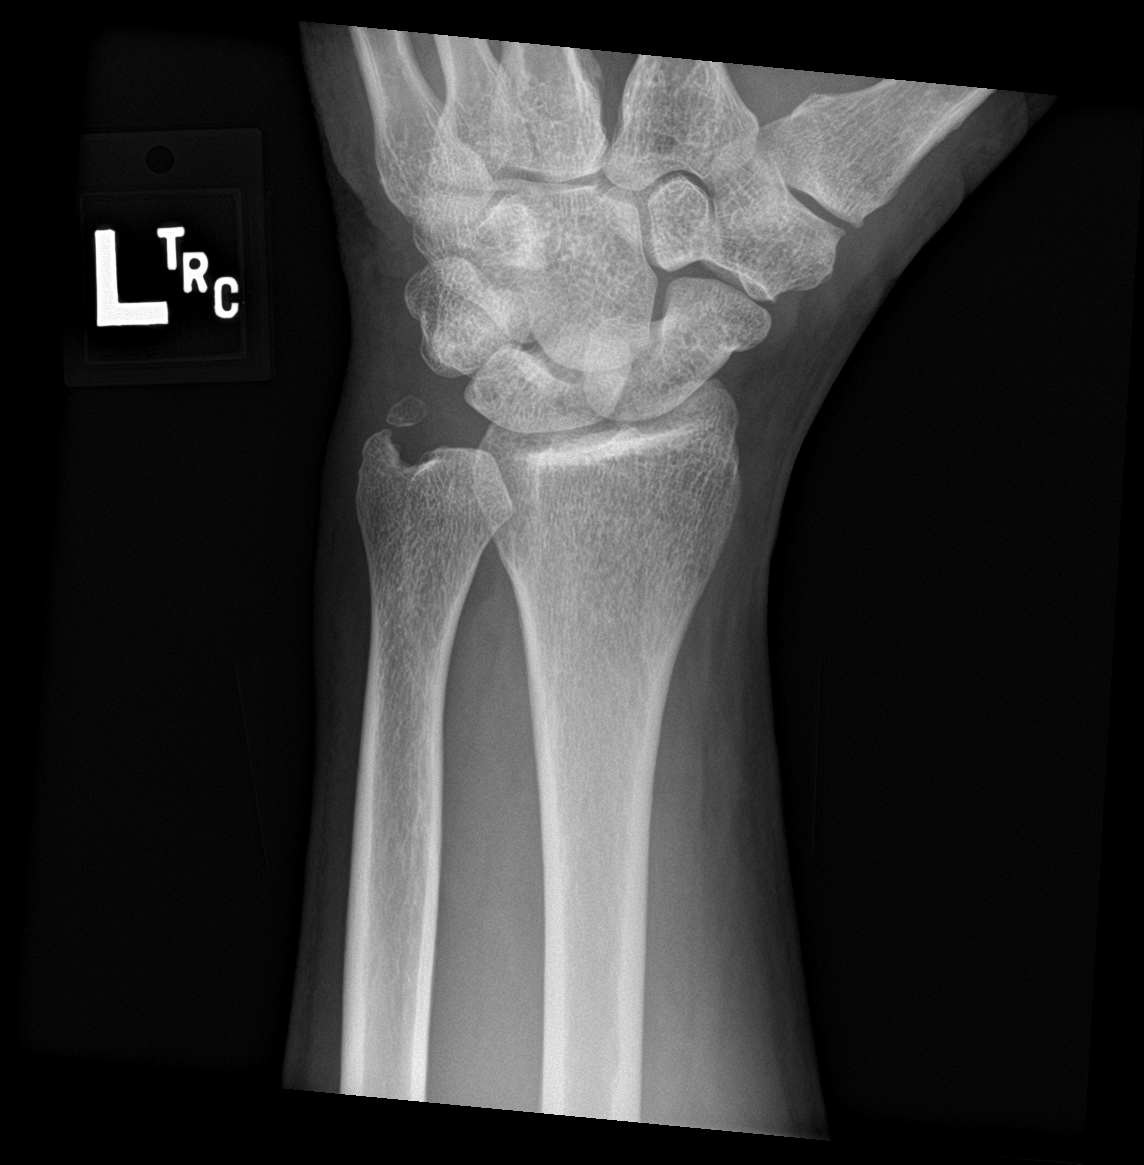

[wrist lat]
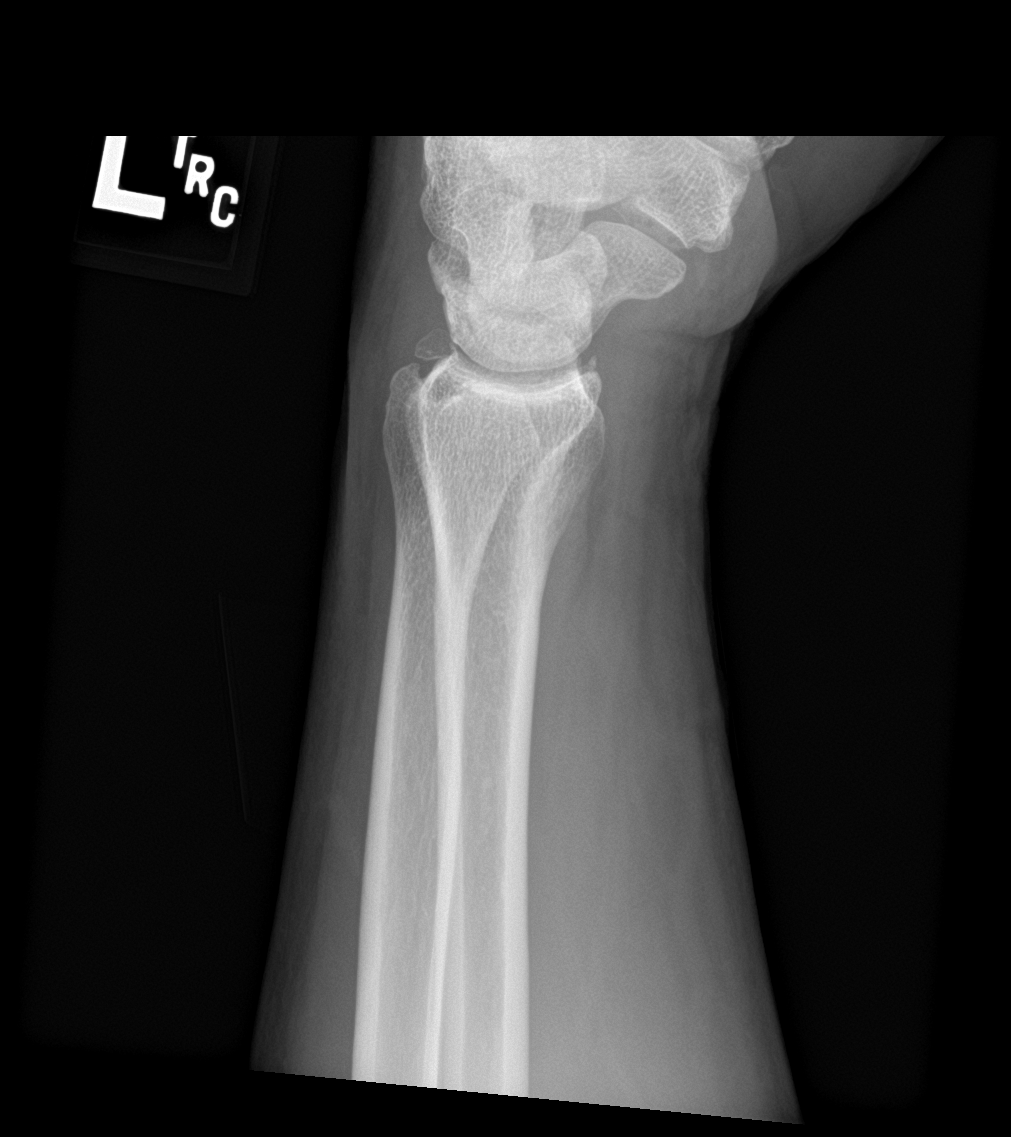

[wrist navicular]
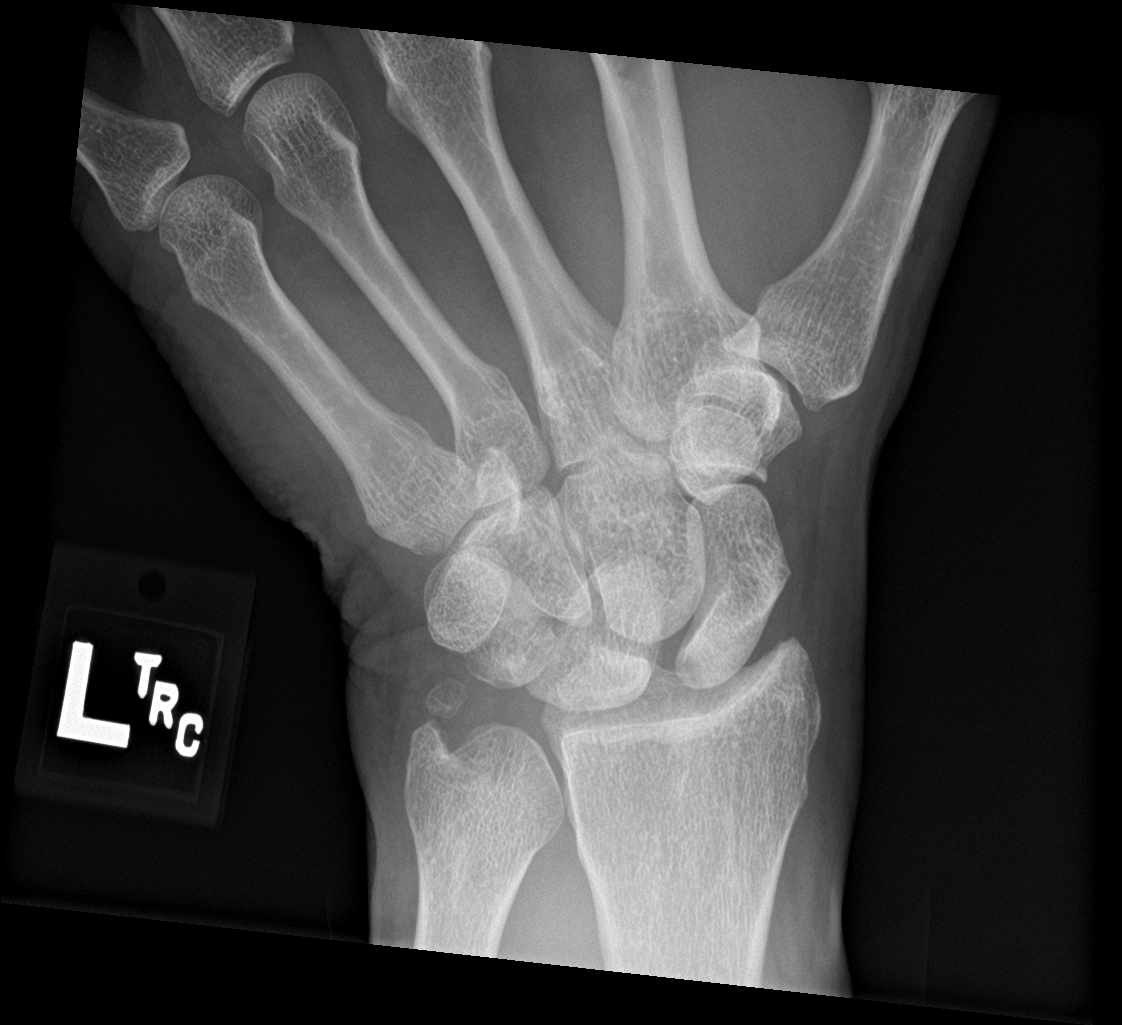

[4 of 4 positions shown; findings below may reference images not displayed]

FINDINGS: There is no evidence of acute fracture or dislocation. Well
corticated osseous density adjacent to the tip of the ulnar styloid,
may reflect unfused ossification center or sequela of remote trauma.
Widening of the scapholunate interval. No proximal migration of the
capitate. There is no evidence of arthropathy or other focal bone
abnormality. Soft tissues are unremarkable.
IMPRESSION: 1. No acute fracture or dislocation of the left wrist.
2. Widening of the scapholunate interval compatible with underlying
scapholunate ligament injury, age indeterminate.

## 2023-09-07 IMAGING — DX DG SHOULDER 2+V*L*
4 series · 4 of 4 positions shown · non-contrast
Comparison: None.

CLINICAL DATA: Fall, left shoulder pain

EXAM:
LEFT SHOULDER - 2+ VIEW

[shoulder grashey]
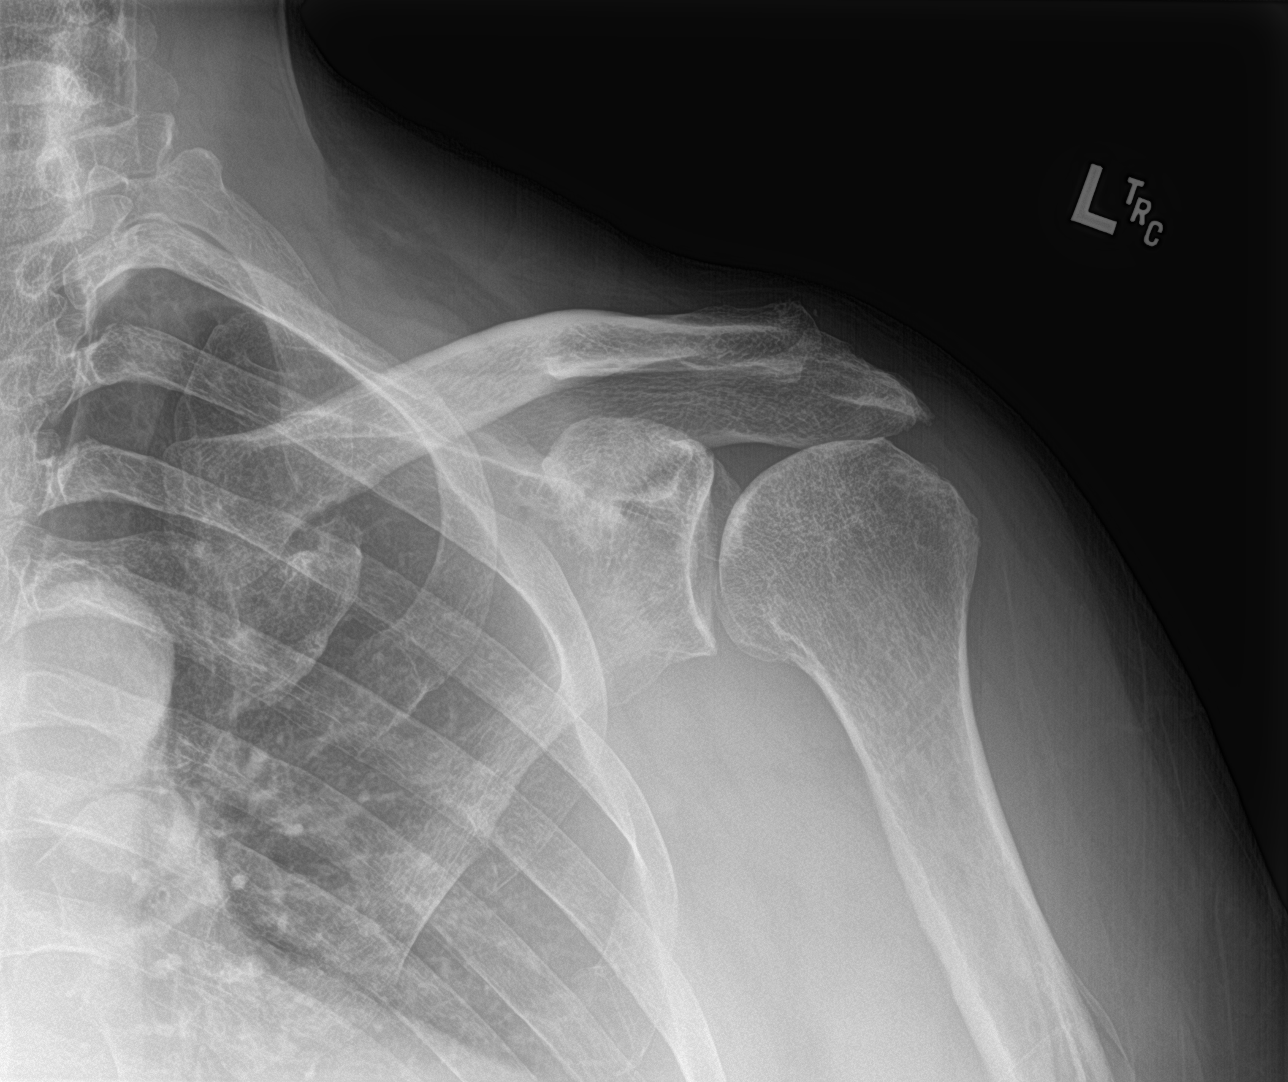

[shoulder y view]
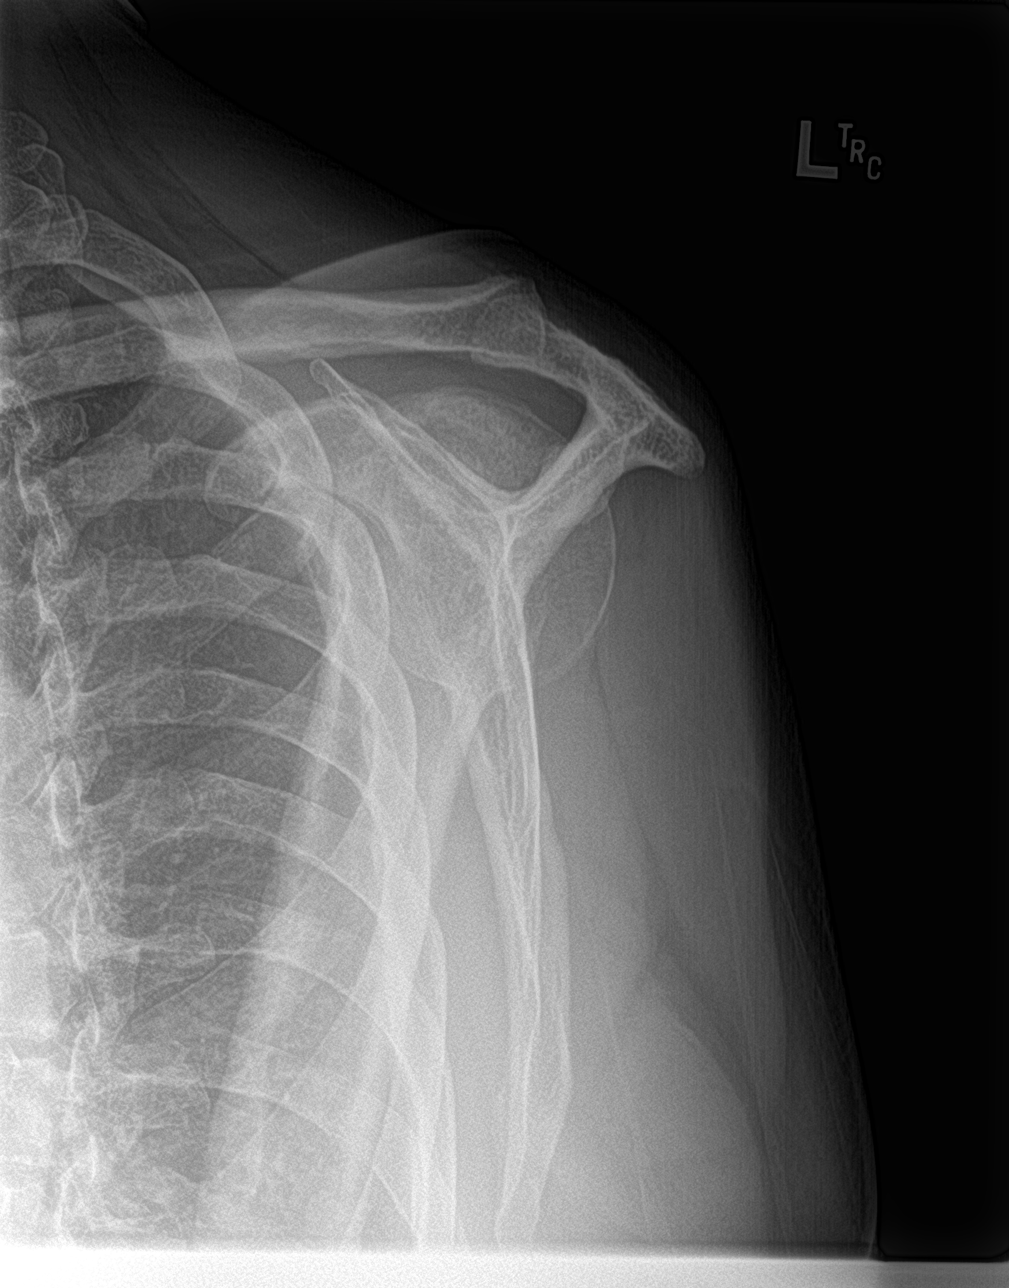

[shoulder axillary]
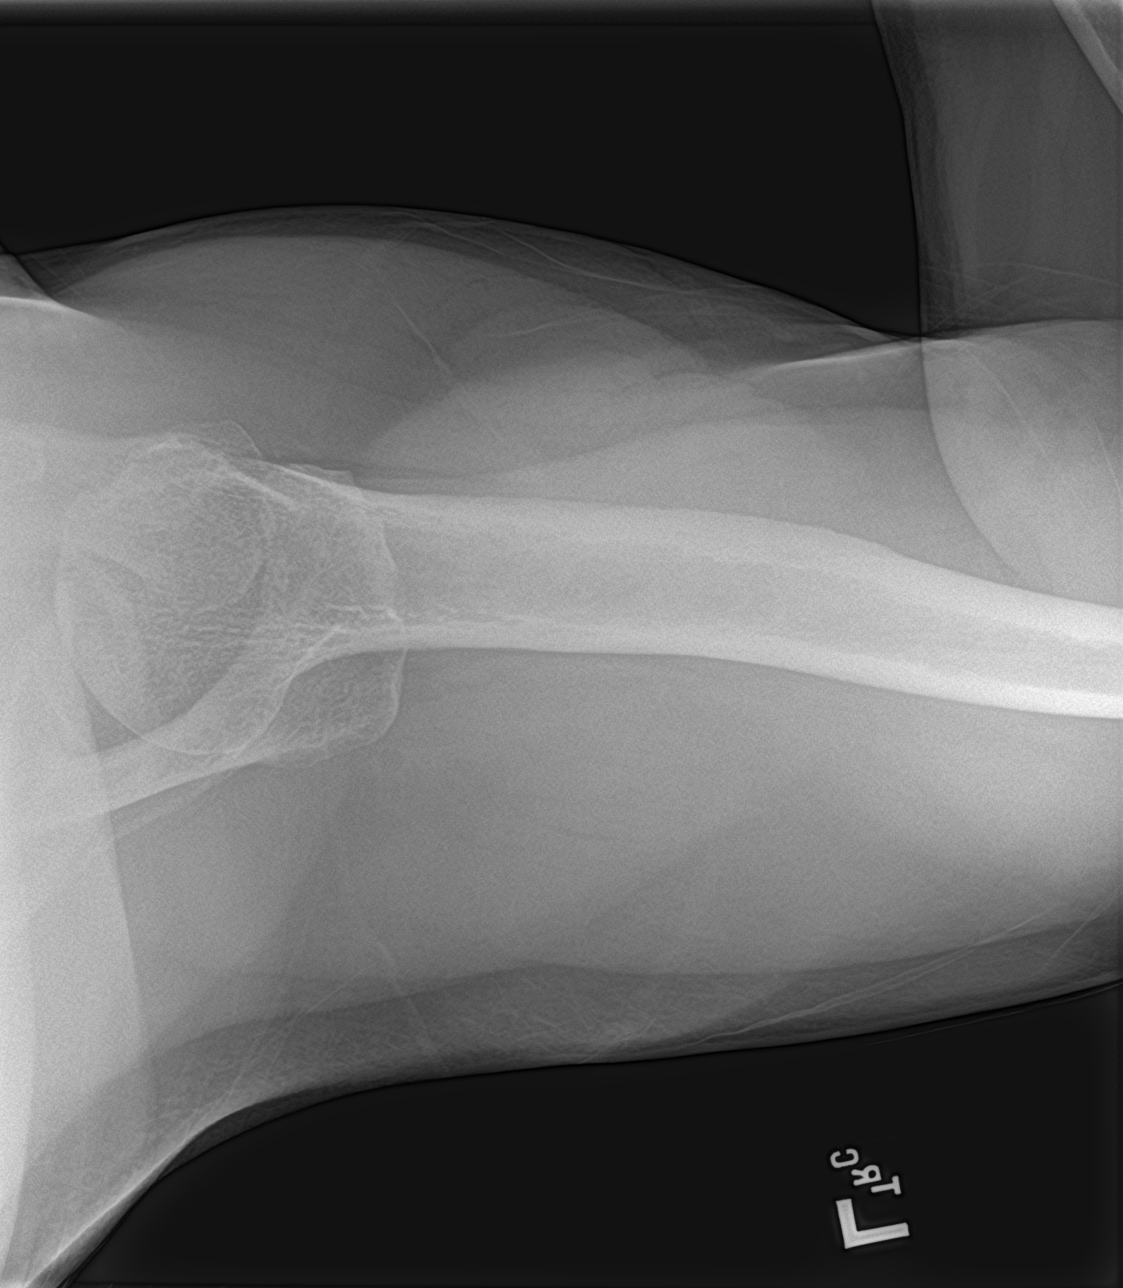

[shoulder ap neutral]
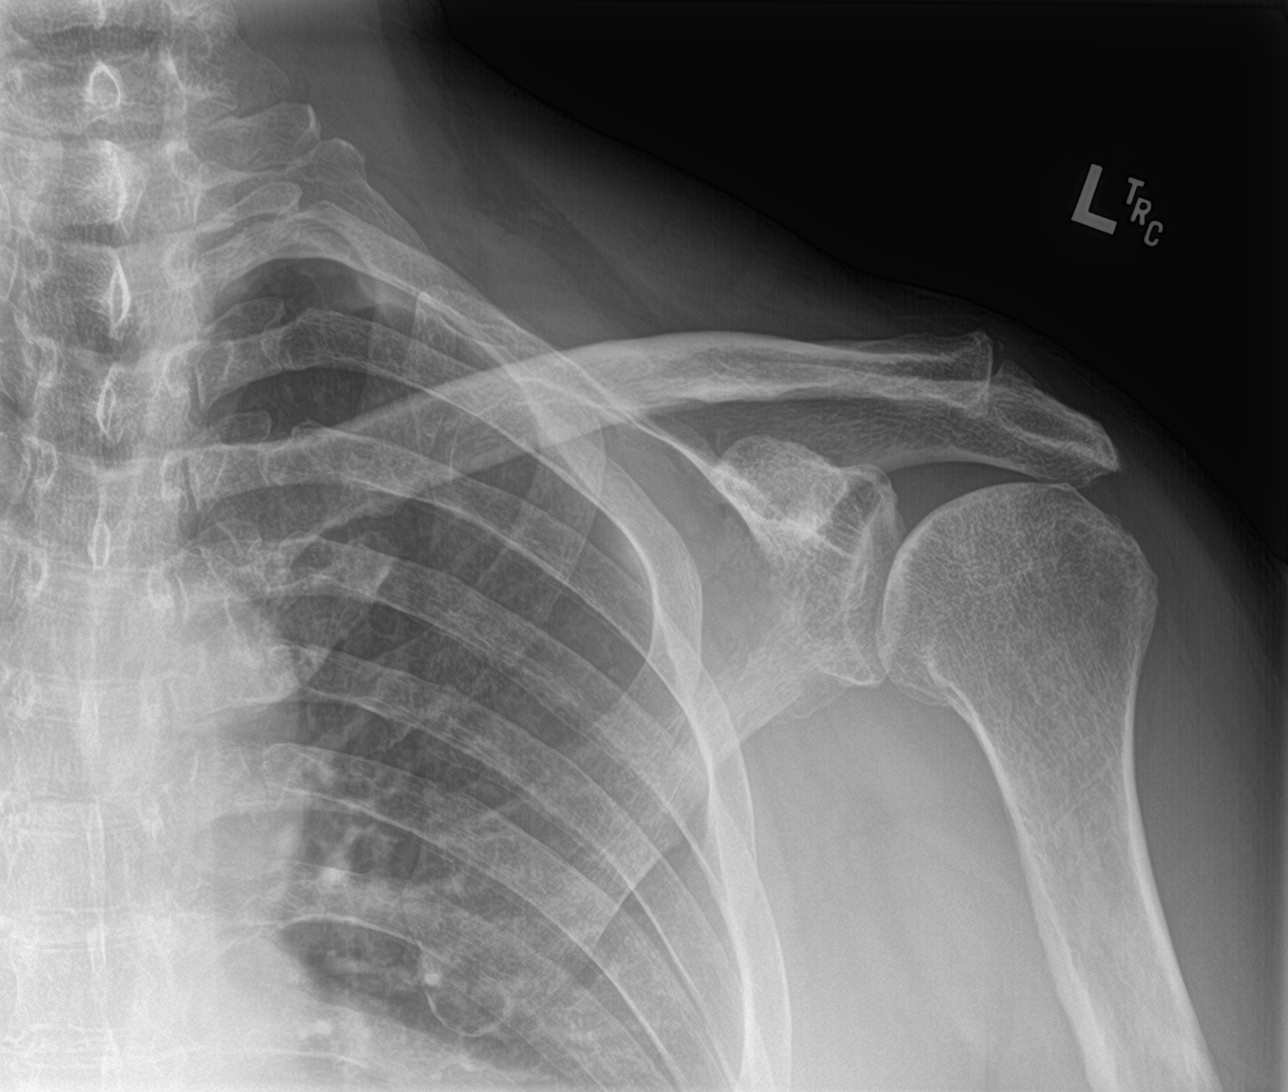

[4 of 4 positions shown; findings below may reference images not displayed]

FINDINGS: There is no evidence of fracture or dislocation. Mild osteoarthritis
of the left glenohumeral and acromioclavicular joints. Soft tissues
are unremarkable.
IMPRESSION: Negative.

## 2023-09-07 IMAGING — DX DG WRIST COMPLETE 3+V*R*
4 series · 4 of 4 positions shown · non-contrast
Comparison: None.

CLINICAL DATA: Fall.  Right hand and wrist pain

EXAM:
RIGHT WRIST - COMPLETE 3+ VIEW; RIGHT HAND - COMPLETE 3+ VIEW

[wrist pa]
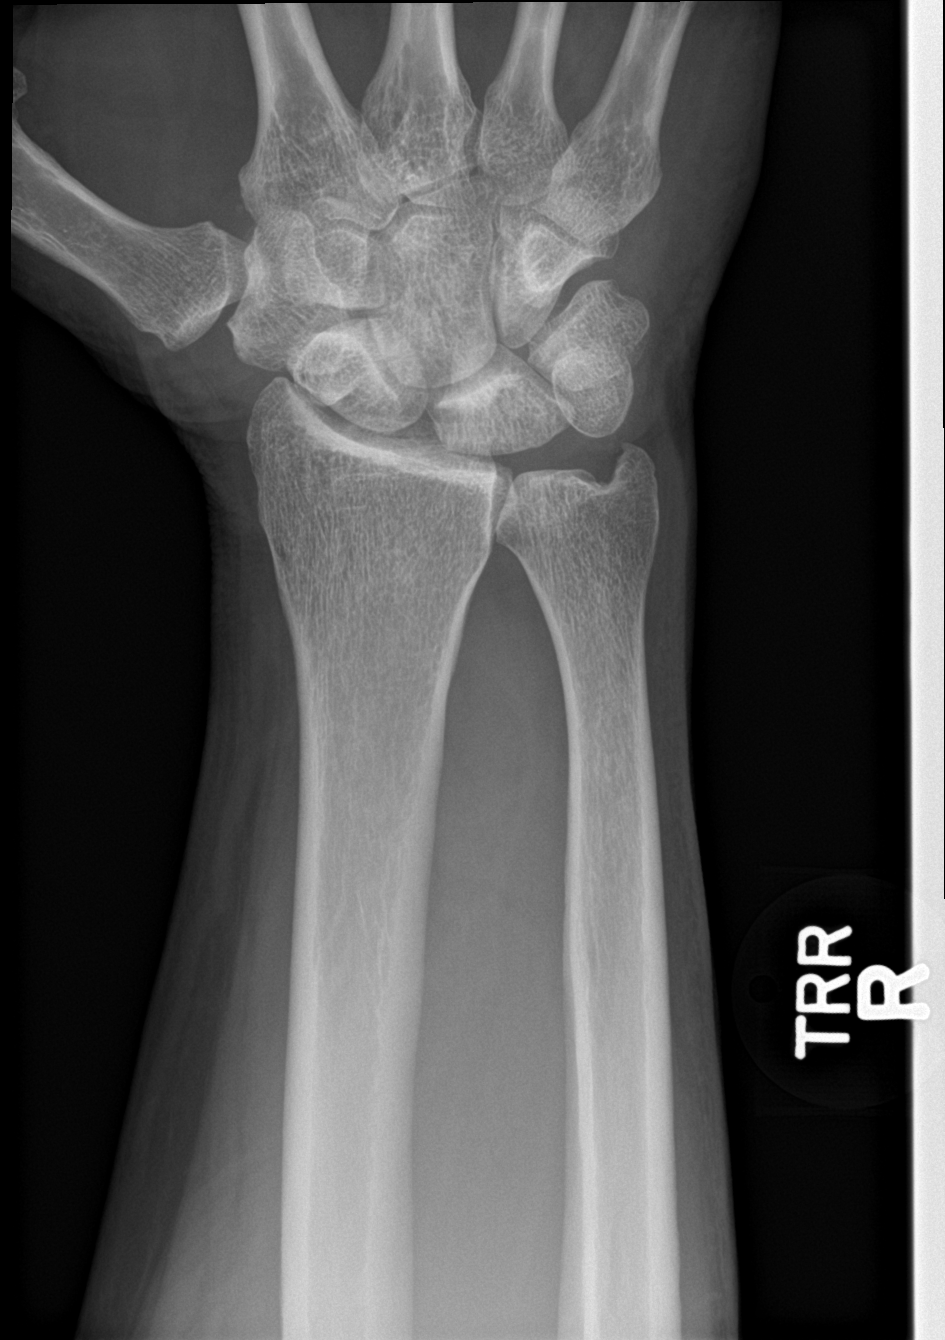

[wrist obl]
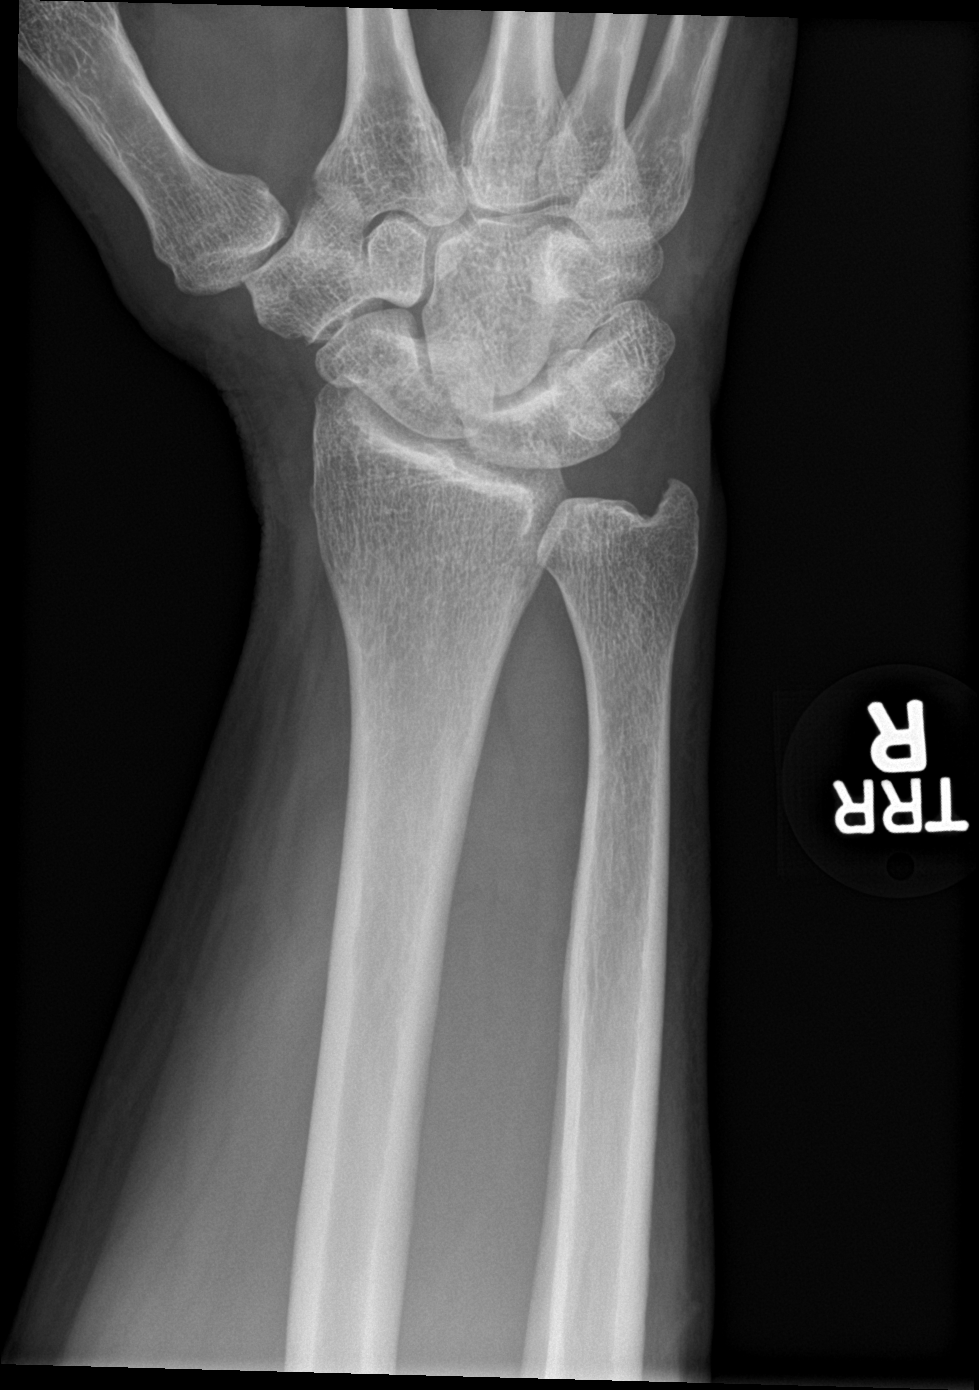

[wrist lat]
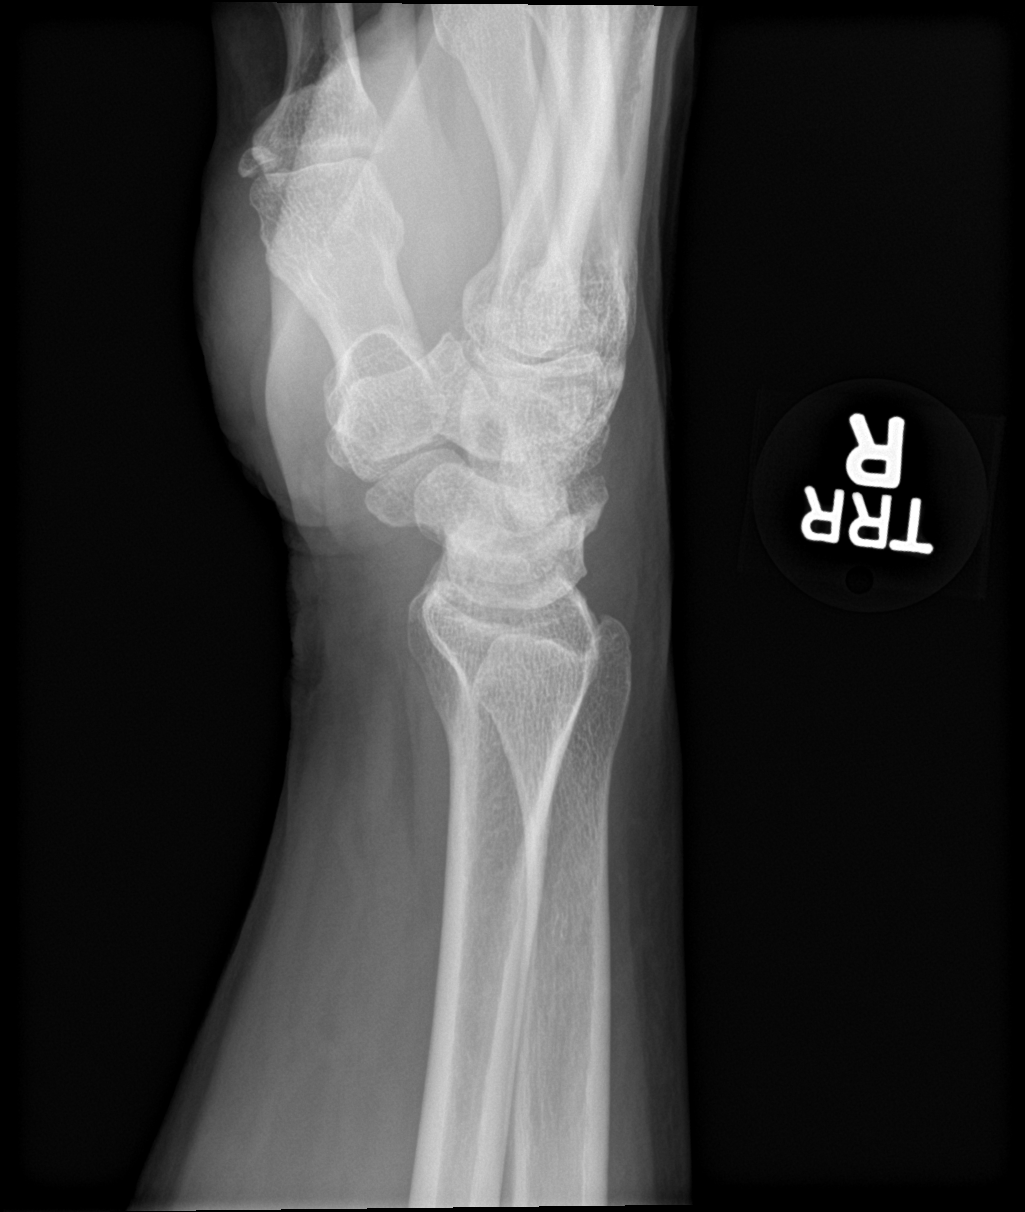

[wrist navicular]
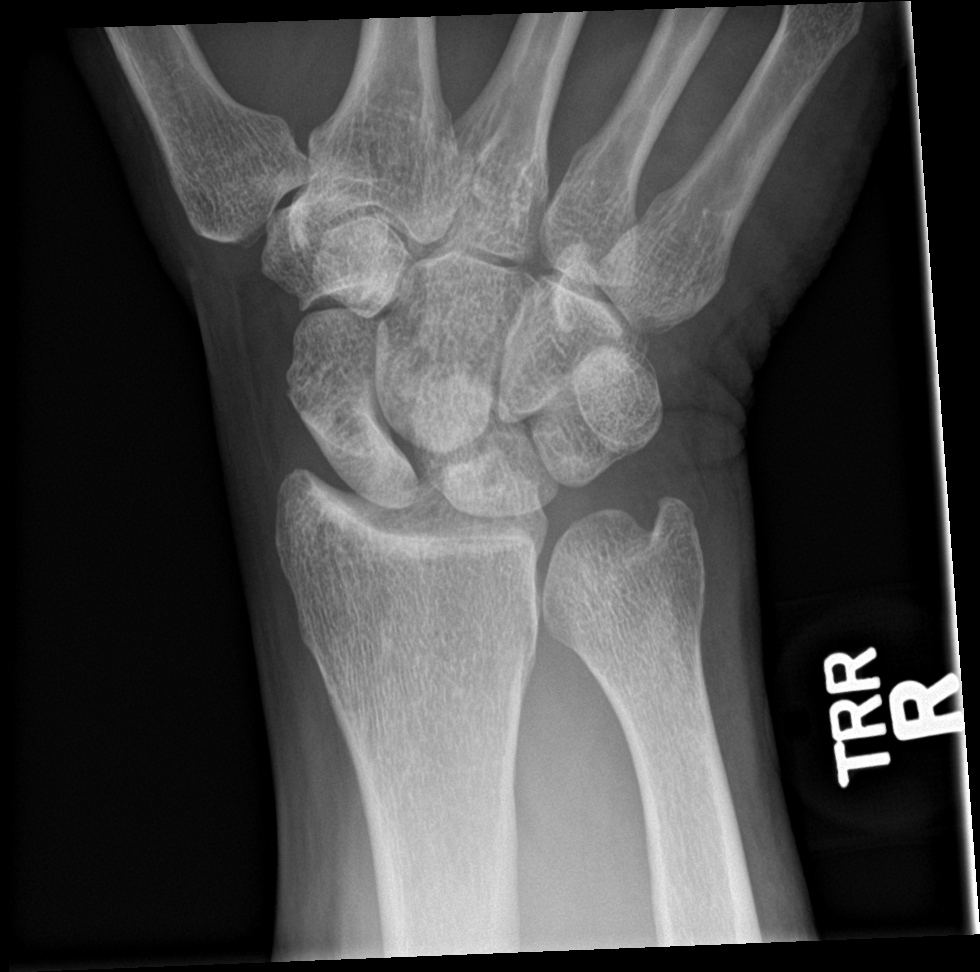

[4 of 4 positions shown; findings below may reference images not displayed]

FINDINGS: No evidence of acute fracture. Chronic appearing fragmentation of
the terminal tuft of the right thumb distal phalanx. No
malalignment. Joint spaces of the right hand and wrist are
relatively well maintained. No focal soft tissue swelling.
IMPRESSION: 1. No evidence of acute fracture or dislocation of the right hand or
wrist.
2. Chronic appearing fragmentation of the terminal tuft of the right
thumb distal phalanx.
